# Patient Record
Sex: Female | Born: 1980
Health system: Southern US, Community
[De-identification: ages and names within clinical notes are randomized; demographics above are authoritative.]

## PROBLEM LIST (undated history)

## (undated) DIAGNOSIS — D649 Anemia, unspecified: Secondary | ICD-10-CM

## (undated) DIAGNOSIS — J45909 Unspecified asthma, uncomplicated: Secondary | ICD-10-CM

## (undated) DIAGNOSIS — T8859XA Other complications of anesthesia, initial encounter: Secondary | ICD-10-CM

## (undated) HISTORY — DX: Unspecified asthma, uncomplicated: J45.909

## (undated) HISTORY — PX: TONSILLECTOMY: SUR1361

## (undated) HISTORY — DX: Other complications of anesthesia, initial encounter: T88.59XA

---

## 2003-09-03 ENCOUNTER — Other Ambulatory Visit: Admission: RE | Admit: 2003-09-03 | Discharge: 2003-09-03 | Payer: Self-pay | Admitting: Obstetrics and Gynecology

## 2004-02-29 ENCOUNTER — Inpatient Hospital Stay (HOSPITAL_COMMUNITY): Admission: AD | Admit: 2004-02-29 | Discharge: 2004-02-29 | Payer: Self-pay | Admitting: *Deleted

## 2004-03-01 ENCOUNTER — Inpatient Hospital Stay (HOSPITAL_COMMUNITY): Admission: AD | Admit: 2004-03-01 | Discharge: 2004-03-03 | Payer: Self-pay | Admitting: Obstetrics and Gynecology

## 2008-12-29 ENCOUNTER — Emergency Department (HOSPITAL_BASED_OUTPATIENT_CLINIC_OR_DEPARTMENT_OTHER): Admission: EM | Admit: 2008-12-29 | Discharge: 2008-12-29 | Payer: Self-pay | Admitting: Emergency Medicine

## 2009-01-18 ENCOUNTER — Emergency Department (HOSPITAL_BASED_OUTPATIENT_CLINIC_OR_DEPARTMENT_OTHER): Admission: EM | Admit: 2009-01-18 | Discharge: 2009-01-18 | Payer: Self-pay | Admitting: Emergency Medicine

## 2009-08-06 ENCOUNTER — Emergency Department (HOSPITAL_BASED_OUTPATIENT_CLINIC_OR_DEPARTMENT_OTHER): Admission: EM | Admit: 2009-08-06 | Discharge: 2009-08-06 | Payer: Self-pay | Admitting: Emergency Medicine

## 2010-03-05 ENCOUNTER — Emergency Department (HOSPITAL_BASED_OUTPATIENT_CLINIC_OR_DEPARTMENT_OTHER): Admission: EM | Admit: 2010-03-05 | Discharge: 2010-03-05 | Payer: Self-pay | Admitting: Emergency Medicine

## 2010-03-05 ENCOUNTER — Ambulatory Visit: Payer: Self-pay | Admitting: Diagnostic Radiology

## 2010-03-09 ENCOUNTER — Ambulatory Visit: Payer: Self-pay | Admitting: Internal Medicine

## 2010-03-09 DIAGNOSIS — M25579 Pain in unspecified ankle and joints of unspecified foot: Secondary | ICD-10-CM

## 2010-05-26 ENCOUNTER — Ambulatory Visit: Payer: Self-pay | Admitting: Internal Medicine

## 2010-06-09 ENCOUNTER — Ambulatory Visit: Payer: Self-pay | Admitting: Internal Medicine

## 2010-06-09 ENCOUNTER — Ambulatory Visit (HOSPITAL_BASED_OUTPATIENT_CLINIC_OR_DEPARTMENT_OTHER): Admission: RE | Admit: 2010-06-09 | Discharge: 2010-06-09 | Payer: Self-pay | Admitting: Internal Medicine

## 2010-06-09 ENCOUNTER — Ambulatory Visit: Payer: Self-pay | Admitting: Interventional Radiology

## 2010-06-09 DIAGNOSIS — R042 Hemoptysis: Secondary | ICD-10-CM | POA: Insufficient documentation

## 2010-09-29 ENCOUNTER — Encounter: Payer: Self-pay | Admitting: Internal Medicine

## 2010-10-12 NOTE — Assessment & Plan Note (Signed)
Summary: NEW PT HURT FOOT FELL DOWN STAIRS/DT   Vital Signs:  Patient profile:   30 year old female Menstrual status:  regular LMP:     03/05/2010 Height:      67 inches Weight:      223.75 pounds BMI:     35.17 O2 Sat:      98 % on Room air Temp:     98.4 degrees F oral Pulse rate:   71 / minute Pulse rhythm:   regular Resp:     16 per minute BP sitting:   104 / 72  (right arm) Cuff size:   large  Vitals Entered By: Glendell Docker CMA (March 09, 2010 11:25 AM)  O2 Flow:  Room air CC: Rm 3-New Patient Is Patient Diabetic? No Pain Assessment Patient in pain? yes     Location: right ankle Intensity: 6 Type: aching Onset of pain  Constant Comments follow up, need FMLA form to be out of work to allow healing of her right ankle. she was going down the step and slid down step.  She currently has soreness in right ankle LMP (date): 03/05/2010     Menstrual Status regular Enter LMP: 03/05/2010 Last PAP Result normal   Primary Care Provider:  Dondra Spry DO  CC:  Rm 3-New Patient.  History of Present Illness: 30 y/o female to establish fell down steps 4 days ago at uncle's house twisted ankle slid down 4 steps not sure how she injured ankle pain is localized to about medial malleolus minimal swelling. pain is worse with wt bearing  taking tylenol NSAIDS cause abd pain  Preventive Screening-Counseling & Management  Alcohol-Tobacco     Alcohol drinks/day: 0     Smoking Status: never  Caffeine-Diet-Exercise     Caffeine use/day: 1 beverage daily     Does Patient Exercise: no  Allergies (verified): 1)  ! 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    Physical Exam  General:  alert, well-developed, and well-nourished.   Head:  normocephalic and atraumatic.   Eyes:  pupils equal, pupils round, and pupils reactive to light.   Mouth:  pharynx pink and moist.   Neck:  No deformities, masses, or tenderness noted. Lungs:  normal respiratory effort and normal breath sounds.   Heart:  normal rate, regular rhythm, and no gallop.   Abdomen:  soft, non-tender, normal bowel sounds, no masses, no hepatomegaly, and no splenomegaly.   Neurologic:  cranial nerves II-XII intact and gait normal.   Skin:  ankle joint stable.  medial ankle tenderness Psych:  normally interactive and good eye contact.     Impression & Recommendations:  Problem # 1:  ANKLE PAIN, RIGHT (ICD-719.47)  pt with recent fall down 4 steps. her pain is localized to medial lower ext about medial malleolus.  question medial ankle sprain.  refer to ortho for further eval  and tx  Orders: Orthopedic Referral (Ortho)  Patient Instructions: 1)  Please schedule a follow-up appointment in 1 month.  Current Allergies (reviewed today): ! PCN   Preventive Care Screening  Pap Smear:    Date:  05/04/2009    Results:  normal   Last Tetanus Booster:    Date:  01/27/2009    Results:  Historical

## 2010-10-12 NOTE — Assessment & Plan Note (Signed)
Summary: FOLLOW UP Stefanie Hale   Vital Signs:  Patient profile:   30 year old female Menstrual status:  regular Height:      67 inches Weight:      224.25 pounds BMI:     35.25 O2 Sat:      100 % on Room air Temp:     98.0 degrees F oral Pulse rate:   69 / minute Pulse rhythm:   regular Resp:     18 per minute BP sitting:   110 / 80  (right arm) Cuff size:   large  Vitals Entered By: Stefanie Hale CMA (June 09, 2010 11:08 AM)  O2 Flow:  Room air CC: follow-up visit Is Patient Diabetic? No Pain Assessment Patient in pain? no        Primary Care Stefanie Hale:  Stefanie Spry DO  CC:  follow-up visit.  History of Present Illness: 30 y/o female c/o waking up with blood tinged sputum or 1 and Hale months . symptoms intermittent. no oral lesions or sores  no chronic cough mild nose bleeding   no fevers or night sweats  overall amt of blood getting better  Preventive Screening-Counseling & Management  Alcohol-Tobacco     Smoking Status: never  Allergies: 1)  ! 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   Lungs:  normal respiratory effort and normal breath sounds.   Heart:  normal rate, regular rhythm, and no gallop.     Impression & Recommendations:  Problem # 1:  HEMOPTYSIS UNSPECIFIED (ICD-786.30) blood tinged sputum in 30 y/o female.  cxr negative.  no obvious source of bleeding on exam of nares.  refer to ENT for further eval Orders: T-2 View CXR, Same Day (71020.5TC) ENT Referral (ENT)  Current Allergies (reviewed today): ! PCN   Immunization History:  Influenza Immunization History:    Influenza:  declined (06/09/2010)   Contraindications/Deferment of  Procedures/Staging:    Test/Procedure: FLU VAX    Reason for deferment: patient declined

## 2010-10-28 NOTE — Consult Note (Signed)
Summary: [facility]  [facility]   Imported By: Lanelle Bal 10/20/2010 07:58:05  _____________________________________________________________________  External Attachment:    Type:   Image     Comment:   External Document

## 2010-10-30 ENCOUNTER — Emergency Department (HOSPITAL_BASED_OUTPATIENT_CLINIC_OR_DEPARTMENT_OTHER)
Admission: EM | Admit: 2010-10-30 | Discharge: 2010-10-30 | Disposition: A | Discharge status: Home / Self Care | Payer: BC Managed Care – PPO | Attending: Emergency Medicine | Admitting: Emergency Medicine

## 2010-10-30 DIAGNOSIS — R112 Nausea with vomiting, unspecified: Secondary | ICD-10-CM | POA: Insufficient documentation

## 2010-10-30 DIAGNOSIS — R197 Diarrhea, unspecified: Secondary | ICD-10-CM | POA: Insufficient documentation

## 2010-10-30 LAB — URINALYSIS, ROUTINE W REFLEX MICROSCOPIC
Ketones, ur: 15 mg/dL — AB
Leukocytes, UA: NEGATIVE
Nitrite: NEGATIVE
Specific Gravity, Urine: 1.03 (ref 1.005–1.030)
Urine Glucose, Fasting: NEGATIVE mg/dL
Urobilinogen, UA: 1 mg/dL (ref 0.0–1.0)
pH: 5.5 (ref 5.0–8.0)

## 2010-10-30 LAB — BASIC METABOLIC PANEL
BUN: 17 mg/dL (ref 6–23)
CO2: 24 mEq/L (ref 19–32)
Calcium: 9.7 mg/dL (ref 8.4–10.5)
Creatinine, Ser: 0.8 mg/dL (ref 0.4–1.2)
Glucose, Bld: 85 mg/dL (ref 70–99)

## 2010-10-30 LAB — PREGNANCY, URINE: Preg Test, Ur: NEGATIVE

## 2010-10-30 LAB — URINE MICROSCOPIC-ADD ON

## 2010-11-03 ENCOUNTER — Encounter: Payer: Self-pay | Admitting: Family

## 2010-11-03 ENCOUNTER — Ambulatory Visit (INDEPENDENT_AMBULATORY_CARE_PROVIDER_SITE_OTHER): Payer: BC Managed Care – PPO | Admitting: Family

## 2010-11-03 DIAGNOSIS — A088 Other specified intestinal infections: Secondary | ICD-10-CM

## 2010-11-09 NOTE — Assessment & Plan Note (Signed)
Summary: seen in ed on sat/norovirus/ss--rm 3   Vital Signs:  Patient profile:   29 year old female Menstrual status:  regular Height:      67 inches Weight:      199 pounds BMI:     31.28 Temp:     98.2 degrees F oral Pulse rate:   60 / minute Pulse rhythm:   regular Resp:     16 per minute BP sitting:   120 / 80  (right arm) Cuff size:   large  Vitals Entered By: Stefanie Hale CMA Stefanie Hale) (November 03, 2010 2:53 PM) CC: Pt here for ER follow up of norovirus. Needs clearance to return to work.  Is Patient Diabetic? No Pain Assessment Patient in pain? no      Comments Pt currently taking Promethazine 25mg  every 6 hours as needed for nausea. Stefanie Hale CMA Stefanie Hale)  November 03, 2010 3:00 PM    Primary Care Provider:  Dondra Spry DO  CC:  Pt here for ER follow up of norovirus. Needs clearance to return to work. .  History of Present Illness: Stefanie Hale is a 30 year old female who presents today for follow up from her ED visit on Satuday downstairs for Norovirus.   She was given rx for phenergan.  Was having nausea/vomitting/diarrhea that day.    No longer vomitting.  Tolerating by mouth's.  Pt has had one stool today which was loose.  Denies associated abdominal pain, but notes that she now has menses with regular cramping.  Allergies: 1)  ! Pcn  Past History:  Past Medical History: Last updated: 06/09/2010 Childhood Asthma     Past Surgical History: Last updated: 06/09/2010 Denies surgical history   Review of Systems       see HPI  Physical Exam  General:  Well-developed,well-nourished,in no acute distress; alert,appropriate and cooperative throughout examination Lungs:  Normal respiratory effort, chest expands symmetrically. Lungs are clear to auscultation, no crackles or wheezes. Heart:  Normal rate and regular rhythm. S1 and S2 normal without gallop, murmur, click, rub or other extra sounds. Abdomen:  soft, Non-distended.  + bowel sounds.   Mild generalized tenderness without guarding.   Impression & Recommendations:  Problem # 1:  VIRAL GASTROENTERITIS (ICD-008.8) Assessment Improved  Symptoms nearly resolved.  FMLA form filled for pt employer and given to patient.    Orders: Form Completion (04540)  Complete Medication List: 1)  Promethazine Hcl 25 Mg Tabs (Promethazine hcl) .... Take 1 tablet every 6 hours as needed for nausea. .  Patient Instructions: 1)  Call if you develop recurrent nausea, vomitting, diarrhea. 2)  Please schedule a complete physical with Dr. Artist Hale.   Orders Added: 1)  Est. Patient Level III [98119] 2)  Form Completion [14782]    Current Allergies (reviewed today): ! PCN

## 2010-11-09 NOTE — Letter (Signed)
Summary: Out of Work  Adult nurse at [employer]. [address]   [place], [place] [address]   Phone: [phone]  Fax: [phone]    November 03, 2010   Employee:  Stefanie Hale    To Whom It May Concern:   For Medical reasons, please excuse the above named employee from work for the following dates:  Start:   10/30/10  End:   May return to work on 11/04/10  If you need additional information, please feel free to contact our office.         Sincerely,    Lemont Fillers FNP

## 2010-11-24 ENCOUNTER — Encounter: Payer: Self-pay | Admitting: Internal Medicine

## 2010-11-24 ENCOUNTER — Ambulatory Visit (INDEPENDENT_AMBULATORY_CARE_PROVIDER_SITE_OTHER): Payer: BC Managed Care – PPO | Admitting: Family

## 2010-11-24 ENCOUNTER — Encounter: Payer: Self-pay | Admitting: Family

## 2010-11-24 DIAGNOSIS — R404 Transient alteration of awareness: Secondary | ICD-10-CM | POA: Insufficient documentation

## 2010-11-24 DIAGNOSIS — R040 Epistaxis: Secondary | ICD-10-CM | POA: Insufficient documentation

## 2010-11-24 LAB — CONVERTED CEMR LAB
Basophils Absolute: 0 10*3/uL (ref 0.0–0.1)
Basophils Relative: 0 % (ref 0–1)
Eosinophils Absolute: 0.1 10*3/uL (ref 0.0–0.7)
Eosinophils Relative: 3 % (ref 0–5)
HCT: 35.8 % — ABNORMAL LOW (ref 36.0–46.0)
Lymphs Abs: 1.7 10*3/uL (ref 0.7–4.0)
Monocytes Absolute: 0.3 10*3/uL (ref 0.1–1.0)
Monocytes Relative: 7 % (ref 3–12)
Neutrophils Relative %: 51 % (ref 43–77)
Platelets: 302 10*3/uL (ref 150–400)
RDW: 13.3 % (ref 11.5–15.5)

## 2010-11-26 ENCOUNTER — Telehealth: Payer: Self-pay | Admitting: Family

## 2010-11-30 NOTE — Assessment & Plan Note (Signed)
Summary: t/ent told pt that it was a blood vessel/ss--rm 4   Vital Signs:  Patient profile:   30 year old female Menstrual status:  regular Height:      67 inches Weight:      197.75 pounds BMI:     31.08 Temp:     98.7 degrees F oral Pulse rate:   66 / minute Pulse rhythm:   regular Resp:     16 per minute BP sitting:   104 / 70  (right arm) Cuff size:   large  Vitals Entered By: Mervin Kung CMA Duncan Dull) (November 24, 2010 11:36 AM) CC: Pt here for follow up. Has intermittent pressure in her throat. Is Patient Diabetic? No Pain Assessment Patient in pain? no      Comments Recently seen by ENT for bleeding in her mouth. Was told he did not see a cause but to try saline drops. Pt doesn't feel the drops are helping and bleeding seems to be increased. Mervin Kung CMA Duncan Dull)  November 24, 2010 11:43 AM    Primary Care Provider:  Dondra Spry DO  CC:  Pt here for follow up. Has intermittent pressure in her throat.Marland Kitchen  History of Present Illness: Ms.  Burbridge is a 30 year old female who presents today for follow up on her epistaxis.    1) Epistaxis-She notes that she sees blood in her throat in AM.  These symptoms have been going on for several month. Also has intermittent throat fullness. Using nasal saline twice daily as recommended.  Feels like she is seeing more blood than she was.   Denies blood from her nose. Does not feel like she is coughing up this blood.  Denies black/bloody stools or diarrhea.  She was seen by ENT (Dr. Verne Spurr) in January and it was felt that her bleeding was coming from her adenoids.  It was noted that she had andenotonsillar hypertrophy.  Pt was instructed to use nasal saline and reassurance was provided.  2) Snoring/Somnolence- Pt does not + snoring history.  Also reports daytime sleepiness.  She will accidentally dose off 2-3x a week by accident if she is watching TV or sitting still.  She denies every having fallen asleep while driving.  She  sleeps  6-7 hours a night.  Mother has sleep apnea.    Preventive Screening-Counseling & Management  Alcohol-Tobacco     Alcohol drinks/day: 0     Smoking Status: never  Allergies: 1)  ! Pcn  Past History:  Past Medical History: Last updated: 06/09/2010 Childhood Asthma     Past Surgical History: Last updated: 06/09/2010 Denies surgical history   Review of Systems       see HPI  Physical Exam  General:  Well-developed,well-nourished,in no acute distress; alert,appropriate and cooperative throughout examination Head:  Normocephalic and atraumatic without obvious abnormalities. No apparent alopecia or balding. Ears:  External ear exam shows no significant lesions or deformities.  Otoscopic examination reveals clear canals, tympanic membranes are intact bilaterally without bulging, retraction, inflammation or discharge. Hearing is grossly normal bilaterally. Mouth:  Narrow oropharynx, no erythema noted. No lesions. Lungs:  Normal respiratory effort, chest expands symmetrically. Lungs are clear to auscultation, no crackles or wheezes. Heart:  Normal rate and regular rhythm. S1 and S2 normal without gallop, murmur, click, rub or other extra sounds. Abdomen:  Bowel sounds positive,abdomen soft and non-tender without masses, organomegaly or hernias noted. Psych:  Cognition and judgment appear intact. Alert and cooperative with normal attention span  and concentration. No apparent delusions, illusions, hallucinations   Impression & Recommendations:  Problem # 1:  EPISTAXIS, RECURRENT (ICD-784.7) Assessment Comment Only Will check baseline CBC.  Recommended that she increase her nasal saline spray to three times a day.  Also recommended that she add a humidifier to her bedroom.   Problem # 2:  SOMNOLENCE (ICD-780.09) Assessment: Comment Only  Patient with daytime somnelence.  + family hx of sleep apnea.  I am concerned that she may suffer from sleep apnea and that the snoring  may be exacebating her epistaxis.  Will refer for sleep study.    Orders: Sleep Disorder Referral (Sleep Disorder)  Other Orders: TLB-CBC Platelet - w/Differential (85025-CBCD)  Patient Instructions: 1)  You wil be contacted about your referral for your sleep study.  2)  Add a humidifier to your bedroom. 3)  Call if you develop worsening bleeding. 4)  Follow up with Dr. Artist Pais in 1 month.   Orders Added: 1)  TLB-CBC Platelet - w/Differential [85025-CBCD] 2)  Sleep Disorder Referral [Sleep Disorder] 3)  New Patient Level IV [16109]    Current Allergies (reviewed today): ! PCN

## 2010-12-09 NOTE — Progress Notes (Signed)
Summary: lab resul  Phone Note Outgoing Call   Summary of Call: Please call patient and let her know that she is mildy anemic.  I would like her to add a multivitamin with minerals once daily and keep her upcoming appointment with Dr. Artist Pais in May. Initial call taken by: Lemont Fillers FNP,  November 26, 2010 8:56 AM  Follow-up for Phone Call        Left message on machine to return my call. Nicki Guadalajara Fergerson CMA Duncan Dull)  November 26, 2010 10:17 AM   Pt advised. Nicki Guadalajara Fergerson CMA Duncan Dull)  November 29, 2010 2:21 PM     New/Updated Medications: WOMENS MULTIVITAMIN PLUS  TABS (MULTIPLE VITAMINS-MINERALS) one tablet by mouth daily

## 2010-12-15 LAB — CBC
Hemoglobin: 12.9 g/dL (ref 12.0–15.0)
Platelets: 282 10*3/uL (ref 150–400)
RBC: 4.19 MIL/uL (ref 3.87–5.11)
RDW: 12.2 % (ref 11.5–15.5)

## 2010-12-15 LAB — URINALYSIS, ROUTINE W REFLEX MICROSCOPIC
Bilirubin Urine: NEGATIVE
Hgb urine dipstick: NEGATIVE
Ketones, ur: NEGATIVE mg/dL
Nitrite: NEGATIVE
Protein, ur: NEGATIVE mg/dL
Specific Gravity, Urine: 1.018 (ref 1.005–1.030)

## 2010-12-15 LAB — COMPREHENSIVE METABOLIC PANEL
Alkaline Phosphatase: 63 U/L (ref 39–117)
CO2: 27 mEq/L (ref 19–32)
GFR calc Af Amer: >60 mL/min (ref >60)
Potassium: 4.2 mEq/L (ref 3.5–5.1)
Sodium: 146 mEq/L — ABNORMAL HIGH (ref 135–145)
Total Protein: 8.9 g/dL — ABNORMAL HIGH (ref 6.0–8.3)

## 2010-12-15 LAB — DIFFERENTIAL
Eosinophils Relative: 1 % (ref 0–5)
Lymphocytes Relative: 22 % (ref 12–46)
Lymphs Abs: 0.8 10*3/uL (ref 0.7–4.0)
Monocytes Absolute: 0.1 10*3/uL (ref 0.1–1.0)

## 2010-12-15 LAB — PREGNANCY, URINE: Preg Test, Ur: NEGATIVE

## 2010-12-15 LAB — LIPASE, BLOOD: Lipase: 84 U/L (ref 23–300)

## 2010-12-28 ENCOUNTER — Encounter (HOSPITAL_BASED_OUTPATIENT_CLINIC_OR_DEPARTMENT_OTHER): Payer: BC Managed Care – PPO

## 2011-01-11 ENCOUNTER — Encounter: Payer: Self-pay | Admitting: Internal Medicine

## 2011-01-12 ENCOUNTER — Ambulatory Visit: Payer: BC Managed Care – PPO | Admitting: Internal Medicine

## 2011-01-12 DIAGNOSIS — Z0289 Encounter for other administrative examinations: Secondary | ICD-10-CM

## 2011-01-28 NOTE — H&P (Signed)
NAME:  Stefanie Hale, Stefanie Hale                    ACCOUNT NO.:  [id number]   MEDICAL RECORD NO.:  [id number]                   PATIENT TYPE:  INP   LOCATION:  9169                                 FACILITY:  [facility]   PHYSICIAN:  Crist Fat. Rivard, M.D.              DATE OF BIRTH:  Jun 10, 1981   DATE OF ADMISSION:  03/01/2004  DATE OF DISCHARGE:                                HISTORY & PHYSICAL   Stefanie Hale is a 30 year old gravida 2, para 0-0-1-0, at 40-3/7 weeks,  who presented with uterine contractions increasing in intensity all night.  She was seen on June 19 for a labor check.  She was 2 cm then.  She was sent  home since she was having minimal contractions.  She reports positive bloody  show and reports positive fetal movement.  Pregnancy has been remarkable  for:   1. Slightly late to care at 14 weeks.  2. 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  3. Trichomonas in January 2005.  4. Slightly decreased fluid on 35-week ultrasound, which resolved by 37     weeks.    PRENATAL LABORATORY DATA:  Blood type is B positive, Rh antibody showed an  anti-LEA inconclusive antibody, and no follow-up was needed.  Sickle cell  test was negative.  Syphilis test was negative.  Rubella titer was positive.  Hepatitis B surface antigen was negative.  HIV was nonreactive.  GC and  Chlamydia cultures were negative.  Pap showed Trichomonas, and she was  treated for this.  Hemoglobin upon entering the practice was 10.9.  It was  10.8 at 26 weeks.  AFP was normal.  Group B strep cultures and GC and  Chlamydia cultures were negative at 36 weeks.  EDC of February 27, 2004, was  established by last menstrual period and was in agreement with ultrasound at  approximately 18 weeks.   HISTORY OF PRESENT PREGNANCY:  The patient entered care at approximately 14  weeks.  She declined cystic fibrosis testing.  Her antibody screen showed  anti-LEA, which was inconclusive.  Consult with Dr. Normand Sloop was  accomplished, and she reported no further follow-up.  Dr. Sherrie George at [facility] also concurred with the plan.  Trichomonas was found on her Pap  and was treated with metronidazole.  She had an ultrasound at 18 weeks that  showed normal growth and development.  She had some slight spotting at  approximately 31 weeks, but she did not call with that at the time.  This  was reported subsequently.  She had an ultrasound at 35 weeks secondary to  slight size greater than dates and position.  Growth was in the 68th-72nd  percentile.  Fluid was 6.3 cm.  There was no evidence of leaking.  An NST  was done.  It was reactive, and the patient was placed on increasing rest  and fluids.  This ultrasound was repeated at 36 weeks, which had a BPP of  8/8 and fluid increased to 8.1 cm.  The rest of her pregnancy was  essentially uncomplicated.   PAST OBSTETRICAL HISTORY:  In 2001 she had a spontaneous miscarriage without  problems in the first trimester.   PAST MEDICAL HISTORY:  She is a previous condom user.  She has a history of  asthma but has had no need for medications any time in the last several  years.  She had a history of mono and strep in August 2004.   She is sensitive to CITRIC ACID.   FAMILY HISTORY:  Mother, maternal grandmother, maternal grandfather, and  maternal great-grandmother have hypertension.  Her mother and maternal  grandmother have had strokes.  Her aunt has varicose veins.  Her mother has  low iron.  Her uncle and maternal grandfather have diabetes.  Maternal  grandmother had skin cancer.  Mother had migraine headaches.  Maternal great-  grandmother had Alzheimer's.   GENETIC HISTORY:  Unremarkable.   SOCIAL HISTORY:  The patient is single.  The father of the baby is involved  and supportive.  His name is Stefanie Hale.  The patient is [place], of the [employer].  She has one year of college and is employed  at [employer]  Her partner is a Engineer, structural.  He is employed  at a [employer].  She has been followed by the certified nurse midwife  service at [facility].  She denies any alcohol, drug, or tobacco  use during this pregnancy.   PHYSICAL EXAMINATION:  VITAL SIGNS:  The patient is afebrile.  Other vital  signs are stable.  HEENT:  Within normal limits.  CHEST:  Bilateral breath sounds are clear.  CARDIAC:  Regular rate and rhythm without murmur.  BREASTS:  Soft and nontender.  ABDOMEN:  Fundal height is approximately 39 cm.  Estimated fetal weight is 7-  8 pounds.  Uterine contractions are every three to four minutes, moderate  quality.  Fetal heart rate is reactive with occasional early decelerations.  Baseline is 120's-130.  PELVIC:  Cervical exam 4-5 cm, 80%, vertex at a -1 station with an intact  bag of water.  EXTREMITIES:  Deep tendon reflexes are 2+ without clonus.  There is a trace  edema noted.   IMPRESSION:  1. Intrauterine pregnancy at 40-3/7 weeks.  2. Early active labor.   PLAN:  1. Admit to birthing suite per consult with Dr. Estanislado Pandy as attending     physician.  2. Routine certified nurse midwife orders.  3. Plan epidural per patient request.     Chip Boer L. Emilee Hero, C.N.M.                   Crist Fat Rivard, M.D.    Leeanne Mannan  D:  03/01/2004  T:  03/01/2004  Job:  [id number]

## 2011-07-19 ENCOUNTER — Ambulatory Visit (INDEPENDENT_AMBULATORY_CARE_PROVIDER_SITE_OTHER): Payer: BC Managed Care – PPO | Admitting: Family

## 2011-07-19 ENCOUNTER — Encounter: Payer: Self-pay | Admitting: Family

## 2011-07-19 DIAGNOSIS — B9789 Other viral agents as the cause of diseases classified elsewhere: Secondary | ICD-10-CM

## 2011-07-19 DIAGNOSIS — B349 Viral infection, unspecified: Secondary | ICD-10-CM

## 2011-07-19 NOTE — Progress Notes (Signed)
  Subjective:    Patient ID: Stefanie Hale, female    DOB: 17-Nov-1980, 30 y.o.   MRN: [id number]  HPI  Pt presents following an episode of weakness at work yesterday (works at [employer]).  She was noted to have BP 98/50.  Felt shakey.  She reports that her symptoms did not improve with eating lunch.  She denies fever, nausea, vomitting, or cold symptoms.  Mild dizziness.  Appetite  is poor today.  LMP was 3 weeks ago.  +malaise/myalgias. Review of Systems See HPI  Past Medical History  Diagnosis Date  . Childhood asthma     History   Social History  . Marital Status: Single    Spouse Name: N/A    Number of Children: N/A  . Years of Education: N/A   Occupational History  . Not on file.   Social History Main Topics  . Smoking status: Never Smoker   . Smokeless tobacco: Not on file  . Alcohol Use: Not on file  . Drug Use: Not on file  . Sexually Active: Not on file   Other Topics Concern  . 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 No past surgeries     denies surgical history    Family History  Problem Relation Age of Onset  . Arthritis    . Hypertension    . Stroke      Allergies  Allergen Reactions  . Penicillins     REACTION: Rash    Current Outpatient Prescriptions on File Prior to Visit  Medication Sig Dispense Refill  . Multiple Vitamin (MULTIVITAMIN) tablet Take 1 tablet by mouth daily.          BP 114/82  Pulse 68  Temp(Src) 98.2 F (36.8 C) (Oral)  Resp 16  Wt 187 lb 1.3 oz (84.859 kg)  SpO2 100%  LMP 06/28/2011       Objective:   Physical Exam  Constitutional: She appears well-developed and well-nourished. No distress.  HENT:  Right Ear: Tympanic membrane and ear canal normal.    Left Ear: Tympanic membrane and ear canal normal.  Mouth/Throat: Uvula is midline, oropharynx is clear and moist and mucous membranes are normal.  Eyes: Conjunctivae are normal. No scleral icterus.  Cardiovascular: Normal rate and regular rhythm.   No murmur heard. Pulmonary/Chest: Effort normal and breath sounds normal. No respiratory distress. She has no wheezes. She has no rales. She exhibits no tenderness.  Musculoskeletal: She exhibits no edema.  Skin: Skin is warm and dry.  Psychiatric: She has a normal mood and affect. Her behavior is normal. Judgment and thought content normal.          Assessment & Plan:

## 2011-07-19 NOTE — Assessment & Plan Note (Signed)
Suspect viral etiology.  Recommended hydration, rest, Motrin PRN.  Work note provided.  Pt to call us if symptoms worsen or if not feeling better in 2-3 days.

## 2011-07-19 NOTE — Patient Instructions (Signed)
You may use motrin as needed for pain, comfort or fever. Call if your symptoms worsen or if you are not feeling better in 2-3 days.

## 2011-07-22 ENCOUNTER — Encounter: Payer: Self-pay | Admitting: Family

## 2011-08-25 ENCOUNTER — Ambulatory Visit: Payer: BC Managed Care – PPO | Admitting: Internal Medicine

## 2011-09-26 ENCOUNTER — Ambulatory Visit: Payer: BC Managed Care – PPO | Admitting: Family

## 2011-09-27 ENCOUNTER — Encounter: Payer: Self-pay | Admitting: Family

## 2011-09-27 ENCOUNTER — Ambulatory Visit (INDEPENDENT_AMBULATORY_CARE_PROVIDER_SITE_OTHER): Payer: BC Managed Care – PPO | Admitting: Family

## 2011-09-27 ENCOUNTER — Telehealth: Payer: Self-pay | Admitting: Family

## 2011-09-27 VITALS — BP 110/80 | HR 78 | Temp 98.2°F | Resp 18 | Wt 193.0 lb

## 2011-09-27 DIAGNOSIS — J020 Streptococcal pharyngitis: Secondary | ICD-10-CM

## 2011-09-27 DIAGNOSIS — J029 Acute pharyngitis, unspecified: Secondary | ICD-10-CM

## 2011-09-27 LAB — POCT RAPID STREP A (OFFICE): Rapid Strep A Screen: POSITIVE — AB

## 2011-09-27 MED ORDER — CLARITHROMYCIN 500 MG PO TABS: 500.0000 mg | ORAL_TABLET | Freq: Two times a day (BID) | ORAL | Status: AC

## 2011-09-27 MED ORDER — CEFUROXIME AXETIL 500 MG PO TABS: 500.0000 mg | ORAL_TABLET | Freq: Two times a day (BID) | ORAL | Status: DC

## 2011-09-27 NOTE — Telephone Encounter (Signed)
Called pharmacy, spoke to pharmacist- cancelled rx for ceftin as pt tells me that her reaction was hives not rash as indicated in chart.

## 2011-09-27 NOTE — Patient Instructions (Addendum)
Call if your symptoms worsen, or if you are not feeling better in 2-3 days. You may use motrin for pain and/or chloraseptic spray.

## 2011-09-27 NOTE — Progress Notes (Signed)
  Subjective:    Patient ID: Stefanie Hale, female    DOB: [name], 1982, 31 y.o.   MRN: [id number]  HPI  Pt is a 31 yr old female who presents today with chief complaint of sore throat.  She reports that symptoms started last Friday with diarrhea. She then developed nasal congestion.  Now has developed sore throat.  She describes pain as moderate: a 6/10.  Has felt hot, but has not taken her temperature. Has tried mucinex without improvement.  Her son was sick.  She had a flu shot.     Review of Systems See HPI  Past Medical History  Diagnosis Date  . Childhood asthma     History   Social History  . Marital Status: Single    Spouse Name: N/A    Number of Children: N/A  . Years of Education: N/A   Occupational History  . Not on file.   Social History Main Topics  . Smoking status: Never Smoker   . Smokeless tobacco: Never Used  . Alcohol Use: Yes  . Drug Use: No  . Sexually Active: Not on file   Other Topics Concern  . 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 No past surgeries     denies surgical history    Family History  Problem Relation Age of Onset  . Arthritis    . Hypertension    . Stroke      Allergies  Allergen Reactions  . Penicillins     REACTION: Rash    Current Outpatient Prescriptions on File Prior to Visit  Medication Sig Dispense Refill  . Multiple Vitamin (MULTIVITAMIN) tablet Take 1 tablet by mouth daily.          BP 110/80  Pulse 78  Temp(Src) 98.2 F (36.8 C) (Oral)  Resp 18  Wt 193 lb (87.544 kg)  SpO2 100%  LMP 09/22/2011       Objective:   Physical Exam  Constitutional: She appears well-developed and well-nourished. No distress.  HENT:  Right Ear: Tympanic membrane  and ear canal normal.  Left Ear: Tympanic membrane and ear canal normal.  Mouth/Throat: Posterior oropharyngeal erythema present. No oropharyngeal exudate or posterior oropharyngeal edema.       Halitosis  Cardiovascular: Normal rate and regular rhythm.   No murmur heard. Pulmonary/Chest: Effort normal and breath sounds normal. No respiratory distress. She has no wheezes. She has no rales. She exhibits no tenderness.  Psychiatric: She has a normal mood and affect. Her behavior is normal. Judgment and thought content normal.          Assessment & Plan:

## 2011-09-27 NOTE — Assessment & Plan Note (Addendum)
Rapid strep is +. Plan to treat with biaxin due to hx of "bad" hives with penicillins.

## 2011-09-29 ENCOUNTER — Telehealth: Payer: Self-pay | Admitting: *Deleted

## 2011-09-29 MED ORDER — CEFDINIR 300 MG PO CAPS: 300.0000 mg | ORAL_CAPSULE | Freq: Two times a day (BID) | ORAL | Status: AC

## 2011-09-29 NOTE — Telephone Encounter (Signed)
Pt's mother called stating that pt is unable to take Biaxin due to stomach cramping and diarrhea. She is requesting another alternative. States pt is still very sick and will be unable to return to work on Friday as stated in her doctor's note. Pt would like a new work note to state that she will return to work on Sunday. Please advise re: medication alternative and if ok to update work note.

## 2011-09-29 NOTE — Telephone Encounter (Signed)
Left detailed message on voicemail re: med change and work note will be at the front desk for pick up and to call if any questions.

## 2011-09-29 NOTE — Telephone Encounter (Signed)
Stop biaxin, start cefdinir.  If no improvement by tomorrow, then she should be re-evaluated in office. OK to extend work note.

## 2011-11-03 ENCOUNTER — Encounter: Payer: BC Managed Care – PPO | Admitting: Internal Medicine

## 2011-11-10 ENCOUNTER — Encounter: Payer: BC Managed Care – PPO | Admitting: Internal Medicine

## 2011-11-17 ENCOUNTER — Encounter: Payer: Self-pay | Admitting: Internal Medicine

## 2011-11-17 ENCOUNTER — Ambulatory Visit (INDEPENDENT_AMBULATORY_CARE_PROVIDER_SITE_OTHER): Payer: BC Managed Care – PPO | Admitting: Internal Medicine

## 2011-11-17 VITALS — BP 100/70 | HR 69 | Temp 98.4°F | Resp 18 | Ht 67.0 in | Wt 192.0 lb

## 2011-11-17 DIAGNOSIS — Z0000 Encounter for general adult medical examination without abnormal findings: Secondary | ICD-10-CM | POA: Insufficient documentation

## 2011-11-17 LAB — CBC WITH DIFFERENTIAL/PLATELET
Eosinophils Absolute: 0.1 10*3/uL (ref 0.0–0.7)
Lymphocytes Relative: 42 % (ref 12–46)
Lymphs Abs: 1.1 10*3/uL (ref 0.7–4.0)
MCH: 30.2 pg (ref 26.0–34.0)
MCHC: 32.7 g/dL (ref 30.0–36.0)
MCV: 92.6 fL (ref 78.0–100.0)
Neutrophils Relative %: 44 % (ref 43–77)
Platelets: 292 10*3/uL (ref 150–400)
RBC: 3.77 MIL/uL — ABNORMAL LOW (ref 3.87–5.11)
WBC: 2.7 10*3/uL — ABNORMAL LOW (ref 4.0–10.5)

## 2011-11-17 LAB — LIPID PANEL
Cholesterol: 165 mg/dL (ref 0–200)
VLDL: 6 mg/dL (ref 0–40)

## 2011-11-17 LAB — HEPATIC FUNCTION PANEL
ALT: 8 U/L (ref 0–35)
Albumin: 4 g/dL (ref 3.5–5.2)
Bilirubin, Direct: 0.1 mg/dL (ref 0.0–0.3)
Total Bilirubin: 0.6 mg/dL (ref 0.3–1.2)
Total Protein: 7.1 g/dL (ref 6.0–8.3)

## 2011-11-17 LAB — VITAMIN B12: Vitamin B-12: 752 pg/mL (ref 211–911)

## 2011-11-17 LAB — BASIC METABOLIC PANEL
Chloride: 106 mEq/L (ref 96–112)
Potassium: 4.6 mEq/L (ref 3.5–5.3)

## 2011-11-17 LAB — TSH: TSH: 0.949 u[IU]/mL (ref 0.350–4.500)

## 2011-11-17 MED ORDER — NYSTATIN 100000 UNIT/GM EX OINT: TOPICAL_OINTMENT | Freq: Two times a day (BID) | CUTANEOUS | Status: AC

## 2011-11-17 NOTE — Assessment & Plan Note (Signed)
Obtain cpe labs. Schedule pap when not menstruating. Attempt nystatin bid for rash. Monitor crepitus as it is without pain or effect on fxn.

## 2011-11-17 NOTE — Patient Instructions (Signed)
Please call to schedule your pap smear or for a gynecologist referral whichever you prefer

## 2011-11-17 NOTE — Progress Notes (Signed)
  Subjective:    Patient ID: Stefanie Hale, female    DOB: 1981-07-05, 31 y.o.   MRN: [id number]  HPI Pt presents to clinic for annual exam. Notes h/o mild anemia without specific etiology. Sometimes takes iron supplement. Last pap ~2 years ago but now menstruating. Recent pharyngitis treated with abx resulted in vaginal yeast infxn cleared by otc monistat however developed ?yeast rash perineum to near rectum. Attempted monistat topically without resolution. Notes intermittent inframammary rash. Has R>L knee crepitus without pain especially when climbing stairs.  Past Medical History  Diagnosis Date  . Childhood asthma    Past Surgical History  Procedure Date  . No past surgeries     denies surgical history    reports that she has never smoked. She has never used smokeless tobacco. She reports that she drinks alcohol. She reports that she does not use illicit drugs. family history includes Arthritis in an unspecified family member; Hypertension in an unspecified family member; and Stroke in an unspecified family member. Allergies  Allergen Reactions  . Penicillins     REACTION: hives     Review of Systems see hpi     Objective:   Physical Exam  Nursing note and vitals reviewed. Constitutional: She appears well-developed and well-nourished. No distress.  HENT:  Head: Normocephalic and atraumatic.  Right Ear: Tympanic membrane and ear canal normal.  Left Ear: Tympanic membrane, external ear and ear canal normal.  Nose: Nose normal.  Mouth/Throat: Oropharynx is clear and moist. No oropharyngeal exudate.  Eyes: Conjunctivae and EOM are normal. Pupils are equal, round, and reactive to light. No scleral icterus.  Neck: Neck supple. Carotid bruit is not present. No thyromegaly present.  Cardiovascular: Normal rate, regular rhythm and normal heart sounds.  Exam reveals no gallop and no friction rub.   No murmur heard. Pulmonary/Chest: Effort normal and breath sounds normal. No  respiratory distress. She has no wheezes. She has no rales.  Abdominal: Soft. Bowel sounds are normal. She exhibits no distension and no mass. There is no hepatosplenomegaly. There is no tenderness. There is no rebound and no guarding.  Musculoskeletal:       Knee: bilateral knee crepitus without tenderness, decreased ROM, erythema, warmth or effusion  Lymphadenopathy:    She has no cervical adenopathy.  Neurological: She is alert.  Skin: Skin is warm and dry. She is not diaphoretic.  Psychiatric: She has a normal mood and affect.          Assessment & Plan:

## 2011-11-18 LAB — URINALYSIS, ROUTINE W REFLEX MICROSCOPIC
Bilirubin Urine: NEGATIVE
Glucose, UA: NEGATIVE mg/dL
Hgb urine dipstick: NEGATIVE
Ketones, ur: NEGATIVE mg/dL
Nitrite: NEGATIVE
Specific Gravity, Urine: 1.017 (ref 1.005–1.030)
Urobilinogen, UA: 1 mg/dL (ref 0.0–1.0)
pH: 7 (ref 5.0–8.0)

## 2011-11-25 ENCOUNTER — Other Ambulatory Visit: Payer: Self-pay | Admitting: Internal Medicine

## 2011-11-25 DIAGNOSIS — D72819 Decreased white blood cell count, unspecified: Secondary | ICD-10-CM

## 2011-12-27 ENCOUNTER — Ambulatory Visit (INDEPENDENT_AMBULATORY_CARE_PROVIDER_SITE_OTHER): Payer: BC Managed Care – PPO | Admitting: Internal Medicine

## 2011-12-27 ENCOUNTER — Encounter: Payer: Self-pay | Admitting: Internal Medicine

## 2011-12-27 ENCOUNTER — Other Ambulatory Visit (HOSPITAL_COMMUNITY)
Admission: RE | Admit: 2011-12-27 | Discharge: 2011-12-27 | Disposition: A | Discharge status: Home / Self Care | Payer: BC Managed Care – PPO | Source: Ambulatory Visit | Attending: Internal Medicine | Admitting: Internal Medicine

## 2011-12-27 VITALS — BP 112/72 | HR 74 | Temp 98.4°F | Resp 18 | Wt 191.0 lb

## 2011-12-27 DIAGNOSIS — N898 Other specified noninflammatory disorders of vagina: Secondary | ICD-10-CM

## 2011-12-27 DIAGNOSIS — Z124 Encounter for screening for malignant neoplasm of cervix: Secondary | ICD-10-CM

## 2011-12-27 DIAGNOSIS — R21 Rash and other nonspecific skin eruption: Secondary | ICD-10-CM

## 2011-12-27 DIAGNOSIS — Z113 Encounter for screening for infections with a predominantly sexual mode of transmission: Secondary | ICD-10-CM | POA: Insufficient documentation

## 2011-12-27 DIAGNOSIS — D72819 Decreased white blood cell count, unspecified: Secondary | ICD-10-CM

## 2011-12-27 DIAGNOSIS — Z01419 Encounter for gynecological examination (general) (routine) without abnormal findings: Secondary | ICD-10-CM | POA: Insufficient documentation

## 2011-12-27 DIAGNOSIS — D649 Anemia, unspecified: Secondary | ICD-10-CM

## 2011-12-27 MED ORDER — FLUCONAZOLE 100 MG PO TABS: 100.0000 mg | ORAL_TABLET | Freq: Every day | ORAL | Status: AC

## 2011-12-28 ENCOUNTER — Other Ambulatory Visit: Payer: Self-pay | Admitting: Internal Medicine

## 2011-12-28 DIAGNOSIS — D649 Anemia, unspecified: Secondary | ICD-10-CM | POA: Insufficient documentation

## 2011-12-28 DIAGNOSIS — R21 Rash and other nonspecific skin eruption: Secondary | ICD-10-CM | POA: Insufficient documentation

## 2011-12-28 DIAGNOSIS — N898 Other specified noninflammatory disorders of vagina: Secondary | ICD-10-CM | POA: Insufficient documentation

## 2011-12-28 DIAGNOSIS — Z124 Encounter for screening for malignant neoplasm of cervix: Secondary | ICD-10-CM | POA: Insufficient documentation

## 2011-12-28 LAB — CBC WITH DIFFERENTIAL/PLATELET
Basophils Absolute: 0 10*3/uL (ref 0.0–0.1)
Basophils Relative: 0 % (ref 0–1)
Eosinophils Absolute: 0.2 10*3/uL (ref 0.0–0.7)
HCT: 37.2 % (ref 36.0–46.0)
Hemoglobin: 12.3 g/dL (ref 12.0–15.0)
MCH: 30 pg (ref 26.0–34.0)
MCV: 90.7 fL (ref 78.0–100.0)
Monocytes Relative: 7 % (ref 3–12)
Platelets: 278 10*3/uL (ref 150–400)
RDW: 12.2 % (ref 11.5–15.5)

## 2011-12-28 LAB — WET PREP BY MOLECULAR PROBE: Gardnerella vaginalis: POSITIVE — AB

## 2011-12-28 MED ORDER — METRONIDAZOLE 500 MG PO TABS: 500.0000 mg | ORAL_TABLET | Freq: Two times a day (BID) | ORAL | Status: AC

## 2011-12-28 NOTE — Progress Notes (Signed)
  Subjective:    Patient ID: Stefanie Hale, female    DOB: 08-02-1981, 31 y.o.   MRN: [id number]  HPI Pt presents to clinic for pap smear and follow up of rash and anemia. Noted with mild anemia on cbc without gross active bleeding. S/p nystatin for perineal rash with improvement without resolution. No other alleviating or exacerbating factors. No other complaints.  Past Medical History  Diagnosis Date  . Childhood asthma    Past Surgical History  Procedure Date  . No past surgeries     denies surgical history    reports that she has never smoked. She has never used smokeless tobacco. She reports that she drinks alcohol. She reports that she does not use illicit drugs. family history includes Arthritis in an unspecified family member; Hypertension in an unspecified family member; and Stroke in an unspecified family member. Allergies  Allergen Reactions  . Penicillins     REACTION: hives     Review of Systems see hpi     Objective:   Physical Exam  Nursing note and vitals reviewed. Constitutional: She appears well-developed and well-nourished. No distress.  HENT:  Head: Normocephalic and atraumatic.  Genitourinary:       With female nurse escort exam is performed. Ext genitalia within nl limits. Speculum exam demonstrates moderate vaginal discharge tan/white without vaginal mucosal lesion. Pap smear obtained.  Neurological: She is alert.  Skin: Skin is warm and dry. She is not diaphoretic.  Psychiatric: She has a normal mood and affect.          Assessment & Plan:

## 2011-12-28 NOTE — Assessment & Plan Note (Signed)
Obtain koh, wet prep, gc and chlamydia.

## 2011-12-28 NOTE — Assessment & Plan Note (Signed)
Attempt diflucan. Followup if no improvement or worsening.

## 2011-12-28 NOTE — Assessment & Plan Note (Signed)
With associated leukopenia. Repeat cbc with diff.

## 2011-12-28 NOTE — Assessment & Plan Note (Signed)
Pap smear pending.

## 2011-12-29 ENCOUNTER — Encounter: Payer: Self-pay | Admitting: Internal Medicine

## 2011-12-29 ENCOUNTER — Ambulatory Visit (INDEPENDENT_AMBULATORY_CARE_PROVIDER_SITE_OTHER): Payer: BC Managed Care – PPO | Admitting: Internal Medicine

## 2011-12-29 ENCOUNTER — Other Ambulatory Visit: Payer: Self-pay | Admitting: Internal Medicine

## 2011-12-29 VITALS — BP 118/62 | HR 76 | Temp 98.5°F

## 2011-12-29 DIAGNOSIS — M549 Dorsalgia, unspecified: Secondary | ICD-10-CM

## 2011-12-29 DIAGNOSIS — Z01419 Encounter for gynecological examination (general) (routine) without abnormal findings: Secondary | ICD-10-CM

## 2011-12-29 MED ORDER — METHYLPREDNISOLONE ACETATE 40 MG/ML IJ SUSP
40.0000 mg | Freq: Once | INTRAMUSCULAR | Status: AC
  Administered 2011-12-29: 40 mg via INTRAMUSCULAR

## 2011-12-29 MED ORDER — DICLOFENAC SODIUM 75 MG PO TBEC: DELAYED_RELEASE_TABLET | ORAL | Status: AC

## 2011-12-29 MED ORDER — KETOROLAC TROMETHAMINE 30 MG/ML IJ SOLN
30.0000 mg | Freq: Once | INTRAMUSCULAR | Status: AC
  Administered 2011-12-29: 30 mg via INTRAMUSCULAR

## 2011-12-29 MED ORDER — HYDROCODONE-ACETAMINOPHEN 5-500 MG PO TABS: 1.0000 | ORAL_TABLET | Freq: Four times a day (QID) | ORAL | Status: AC | PRN

## 2011-12-29 NOTE — Progress Notes (Signed)
Received call from cytology that 12/27/11 pap smear was ordered under [facility] and needs to be ordered under [facility]. Unable to cancel previous pap smear, order re-entered.

## 2012-01-01 DIAGNOSIS — M549 Dorsalgia, unspecified: Secondary | ICD-10-CM | POA: Insufficient documentation

## 2012-01-01 NOTE — Progress Notes (Signed)
  Subjective:    Patient ID: Stefanie Hale, female    DOB: March 06, 1981, 31 y.o.   MRN: [id number]  HPI Pt presents to clinic for evaluation of back pain. Notes one day h/o right low back pain that occurs while bending and putting pants on. Pain is sharp and radiates to the right thigh without further radiation, paresthesia, leg weakness or incontinence. Notes pain is severe. No fall or trauma involved. Taking otc pain medication without improvement. Pain worse with movement. No other alleviating or exacerbating factors.   Past Medical History  Diagnosis Date  . Childhood asthma    Past Surgical History  Procedure Date  . No past surgeries     denies surgical history    reports that she has never smoked. She has never used smokeless tobacco. She reports that she drinks alcohol. She reports that she does not use illicit drugs. family history includes Arthritis in an unspecified family member; Hypertension in an unspecified family member; and Stroke in an unspecified family member. Allergies  Allergen Reactions  . Penicillins     REACTION: hives     Review of Systems see hpi     Objective:   Physical Exam  Constitutional: She appears well-developed and well-nourished. No distress.  HENT:  Head: Normocephalic and atraumatic.  Eyes: Conjunctivae are normal. No scleral icterus.  Neck: Neck supple.  Musculoskeletal:       No midline ls tenderness or bony abn. Right paraspinal muscle tenderness without overt spasm. Modified slr neg.bilateral le strength 5/5.  Neurological: She is alert.  Skin: Skin is warm and dry. She is not diaphoretic.  Psychiatric: She has a normal mood and affect.          Assessment & Plan:

## 2012-01-01 NOTE — Assessment & Plan Note (Signed)
Administer depomedrol im and toradol im. Attempt voltraren prn with food and no other nsaids. lortab prn short term. Work note provided. Followup if no improvement or worsening.

## 2013-07-01 ENCOUNTER — Ambulatory Visit (INDEPENDENT_AMBULATORY_CARE_PROVIDER_SITE_OTHER): Payer: BC Managed Care – PPO | Admitting: Family

## 2013-07-01 ENCOUNTER — Encounter: Payer: Self-pay | Admitting: Family

## 2013-07-01 VITALS — BP 118/80 | HR 79 | Temp 98.7°F | Resp 16 | Ht 67.0 in | Wt 205.0 lb

## 2013-07-01 DIAGNOSIS — E039 Hypothyroidism, unspecified: Secondary | ICD-10-CM

## 2013-07-01 DIAGNOSIS — N926 Irregular menstruation, unspecified: Secondary | ICD-10-CM | POA: Insufficient documentation

## 2013-07-01 DIAGNOSIS — Z309 Encounter for contraceptive management, unspecified: Secondary | ICD-10-CM

## 2013-07-01 LAB — POCT URINE HCG BY VISUAL COLOR COMPARISON TESTS: Preg Test, Ur: NEGATIVE

## 2013-07-01 NOTE — Assessment & Plan Note (Addendum)
Will check TSH.  Urine HCG is negative. Refer to gyn.

## 2013-07-01 NOTE — Progress Notes (Signed)
  Subjective:    Patient ID: Stefanie Hale, female    DOB: 1980/11/14, 32 y.o.   MRN: [id number]  HPI  Stefanie Hale is a 32 yr old female who presents today to discuss irregular menstrual bleeding.  Had normal menses 10/10-10/14.  She has continued to have vaginal spotting since Friday. Reports that last month she had one day of spotting mid cycle.  Reports that the week following she had 1 day of nipple soreness.  Reports that soreness resolved after 1 day.  Had normal pap smear last year. Denies any current vaginal discharge or concern re: std exposure. She is not currently using birth control. She is sexually active. In the past she has been on yaz.     Review of Systems See HPI  Past Medical History  Diagnosis Date  . Childhood asthma     History   Social History  . Marital Status: Single    Spouse Name: N/A    Number of Children: N/A  . Years of Education: N/A   Occupational History  . Not on file.   Social History Main Topics  . Smoking status: Never Smoker   . Smokeless tobacco: Never Used  . Alcohol Use: Yes  . Drug Use: No  . Sexual Activity: Not on file   Other Topics Concern  . 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 No past surgeries      denies surgical history    Family History  Problem Relation Age of Onset  . Arthritis    . Hypertension    . Stroke      Allergies  Allergen Reactions  . Influenza Vaccines Other (See Comments)    Arm swelling and felt very bad.  . Penicillins     REACTION: hives    No current outpatient prescriptions on file prior to visit.   No current facility-administered medications on file prior to visit.    BP  118/80  Pulse 79  Temp(Src) 98.7 F (37.1 C) (Oral)  Resp 16  Ht 5\' 7"  (1.702 m)  Wt 205 lb (92.987 kg)  BMI 32.1 kg/m2  SpO2 98%  LMP 06/25/2013       Objective:   Physical Exam  Constitutional: She is oriented to person, place, and time. She appears well-developed and well-nourished. No distress.  HENT:  Head: Normocephalic and atraumatic.  Cardiovascular: Normal rate and regular rhythm.   No murmur heard. Pulmonary/Chest: Effort normal and breath sounds normal. No respiratory distress. She has no wheezes. She has no rales. She exhibits no tenderness.  Abdominal: Soft. Bowel sounds are normal. She exhibits no distension and no mass. There is no tenderness. There is no rebound and no guarding.  Musculoskeletal: She exhibits no edema.  Neurological: She is alert and oriented to person, place, and time.  Psychiatric: She has a normal mood and affect. Her behavior is normal. Judgment and thought content normal.          Assessment & Plan:

## 2013-07-01 NOTE — Patient Instructions (Signed)
Please complete lab work prior to leaving. You will be contacted about your referral to GYN.  

## 2013-07-01 NOTE — Addendum Note (Signed)
Addended by: Mervin Kung A on: 07/01/2013 05:13 PM   Modules accepted: Orders

## 2013-07-01 NOTE — Assessment & Plan Note (Signed)
She does not wish to resume OCP.  She is interested in another form of birth control- possibly IUD.  Will refer to GYN for further evaluation.

## 2013-07-02 ENCOUNTER — Encounter: Payer: Self-pay | Admitting: Family

## 2013-07-02 LAB — TSH: TSH: 1.205 u[IU]/mL (ref 0.350–4.500)

## 2013-07-25 ENCOUNTER — Ambulatory Visit (INDEPENDENT_AMBULATORY_CARE_PROVIDER_SITE_OTHER): Payer: BC Managed Care – PPO | Admitting: Physician Assistant

## 2013-07-25 ENCOUNTER — Encounter: Payer: Self-pay | Admitting: Physician Assistant

## 2013-07-25 VITALS — BP 122/80 | HR 78 | Temp 98.7°F | Ht 67.0 in | Wt 209.0 lb

## 2013-07-25 DIAGNOSIS — J329 Chronic sinusitis, unspecified: Secondary | ICD-10-CM

## 2013-07-25 MED ORDER — AZITHROMYCIN 250 MG PO TABS: ORAL_TABLET | ORAL | Status: DC

## 2013-07-25 NOTE — Progress Notes (Signed)
Pre visit review using our clinic review tool, if applicable. No additional management support is needed unless otherwise documented below in the visit note. 

## 2013-07-25 NOTE — Progress Notes (Signed)
Patient ID: Stefanie Hale, female   DOB: Mar 04, 1981, 32 y.o.   MRN: [id number]  Patient presents to clinic today c/o sinus pressure, pain, nasal congestion and drainage x 8-9 days.  Patient denies fever, chills.  Endorses mild, dry cough.  Denies shortness of breath or wheezing.  Denies seasonal allergies.  Does endorse childhood asthma but no symptoms in adulthood.  Has not taken anything for symptoms.   Past Medical History  Diagnosis Date  . Childhood asthma     No current outpatient prescriptions on file prior to visit.   No current facility-administered medications on file prior to visit.    Allergies  Allergen Reactions  . Influenza Vaccines Other (See Comments)    Arm swelling and felt very bad.  . Penicillins     REACTION: hives    Family History  Problem Relation Age of Onset  . Arthritis    . Hypertension    . Stroke      History   Social History  . Marital Status: Single    Spouse Name: N/A    Number of Children: N/A  . Years of Education: N/A   Social History Main Topics  . Smoking status: Never Smoker   . Smokeless tobacco: Never Used  . Alcohol Use: Yes  . Drug Use: No  . Sexual Activity: None   Other Topics Concern  . 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  All other ROS are negative.   Filed Vitals:   07/25/13 1557  BP: 122/80  Pulse: 78  Temp: 98.7 F (37.1 C)   Physical Exam  Vitals reviewed. Constitutional: She is oriented to person, place, and time and well-developed, well-nourished, and in no distress.  HENT:  Head: Normocephalic and atraumatic.  Right Ear: External ear normal.  Left Ear: External ear normal.  Nose: Nose normal.  Mouth/Throat: Oropharynx is clear and moist.  No oropharyngeal exudate.  Tympanic membranes within normal limits bilaterally.  Tenderness to percussion of bilateral maxillary sinuses on exam.  Eyes: Conjunctivae are normal.  Neck: Neck supple.  Cardiovascular: Normal rate, regular rhythm, normal heart sounds and intact distal pulses.   Pulmonary/Chest: Breath sounds normal. No respiratory distress. She has no wheezes. She has no rales. She exhibits no tenderness.  Lymphadenopathy:    She has no cervical adenopathy.  Neurological: She is alert and oriented to person, place, and time.  Skin: Skin is warm and dry. No rash noted.  Psychiatric: Affect normal.   Recent Results (from the past 2160 hour(s))  TSH     Status: None   Collection Time    07/01/13  3:30 PM      Result Value Range   TSH 1.205  0.350 - 4.500 uIU/mL  POCT URINE HCG BY VISUAL COLOR COMPARISON TESTS     Status: None   Collection Time    07/01/13  5:12 PM      Result Value Range   Preg Test, Ur Negative     Assessment/Plan: Sinusitis Rx Azithromycin.  Increase fluids.  Rest.  Saline nasal spray.  Humidifier in bedroom.  Delsym or other  OTC med for cough.

## 2013-07-25 NOTE — Assessment & Plan Note (Signed)
Rx Azithromycin.  Increase fluids.  Rest.  Saline nasal spray.  Humidifier in bedroom.  Delsym or other OTC med for cough.

## 2013-07-25 NOTE — Patient Instructions (Signed)
Please take antibiotic as prescribed.  Please use saline nasal spray daily.  Increase fluid intake.  Get plenty of rest.  Place a humidifier in the bedroom.  Can continue over the counter medications as needed.  Consider a daily claritin.  Please call or return to clinic if symptoms are not improving.

## 2013-07-26 ENCOUNTER — Encounter: Payer: Self-pay | Admitting: *Deleted

## 2013-08-04 ENCOUNTER — Emergency Department (HOSPITAL_BASED_OUTPATIENT_CLINIC_OR_DEPARTMENT_OTHER): Payer: BC Managed Care – PPO

## 2013-08-04 ENCOUNTER — Emergency Department (HOSPITAL_BASED_OUTPATIENT_CLINIC_OR_DEPARTMENT_OTHER)
Admission: EM | Admit: 2013-08-04 | Discharge: 2013-08-04 | Disposition: A | Discharge status: Home / Self Care | Payer: BC Managed Care – PPO | Attending: Emergency Medicine | Admitting: Emergency Medicine

## 2013-08-04 ENCOUNTER — Encounter (HOSPITAL_BASED_OUTPATIENT_CLINIC_OR_DEPARTMENT_OTHER): Payer: Self-pay | Admitting: Emergency Medicine

## 2013-08-04 DIAGNOSIS — Z880 Allergy status to penicillin: Secondary | ICD-10-CM | POA: Insufficient documentation

## 2013-08-04 DIAGNOSIS — J45901 Unspecified asthma with (acute) exacerbation: Secondary | ICD-10-CM | POA: Insufficient documentation

## 2013-08-04 DIAGNOSIS — J40 Bronchitis, not specified as acute or chronic: Secondary | ICD-10-CM

## 2013-08-04 MED ORDER — ALBUTEROL SULFATE HFA 108 (90 BASE) MCG/ACT IN AERS
2.0000 | INHALATION_SPRAY | RESPIRATORY_TRACT | Status: DC | PRN
  Administered 2013-08-04: 2 via RESPIRATORY_TRACT
  Filled 2013-08-04: qty 6.7

## 2013-08-04 NOTE — ED Provider Notes (Signed)
CSN: [id number]     Arrival date & time 08/04/13  1209 History   First MD Initiated Contact with Patient 08/04/13 1221     Chief Complaint  Patient presents with  . Nasal Congestion   (Consider location/radiation/quality/duration/timing/severity/associated sxs/prior Treatment) HPI Comments: Patient presents with cough and congestion. She states it started about 3 weeks ago. She's had some nasal congestion postnasal drip and coughing. She took a course of antibiotics recently with no improvement in symptoms. She states her cough seems to be getting worse and she feels some pain in the center of her chest. It is worse with coughing. She denies any shortness of breath. She denies any leg pain or swelling. She has a history of childhood asthma but hasn't had issues with a long time. She denies any recent fevers   Past Medical History  Diagnosis Date  . Childhood asthma    Past Surgical History  Procedure Laterality Date  . No past surgeries      denies surgical history   Family History  Problem Relation Age of Onset  . Arthritis    . Hypertension    . Stroke     History  Substance Use Topics  . Smoking status: Never Smoker   . Smokeless tobacco: Never Used  . Alcohol Use: Yes   OB History   Grav Para Term Preterm Abortions TAB SAB Ect Mult Living                 Review of Systems  Constitutional: Negative for fever, chills, diaphoresis and fatigue.  HENT: Positive for congestion, postnasal drip, rhinorrhea and sinus pressure. Negative for sneezing.   Eyes: Negative.   Respiratory: Positive for cough. Negative for chest tightness and shortness of breath.   Cardiovascular: Positive for chest pain. Negative for leg swelling.  Gastrointestinal: Negative for nausea, vomiting, abdominal pain, diarrhea and blood in stool.  Genitourinary: Negative for frequency, hematuria, flank pain and difficulty urinating.  Musculoskeletal: Negative for arthralgias and back pain.  Skin: Negative  for rash.  Neurological: Negative for dizziness, speech difficulty, weakness, numbness and headaches.    Allergies  Influenza vaccines and Penicillins  Home Medications   Current Outpatient Rx  Name  Route  Sig  Dispense  Refill  . azithromycin (ZITHROMAX) 250 MG tablet      Take 2 tablets on day 1.  Then take 1 tablet daily until all pills are gone.   6 tablet   0    BP 133/78  Pulse 75  Temp(Src) 97.9 F (36.6 C) (Oral)  Resp 18  SpO2 100%  LMP 07/14/2013 Physical Exam  Constitutional: She is oriented to person, place, and time. She appears well-developed and well-nourished.  HENT:  Head: Normocephalic and atraumatic.  Right Ear: External ear normal.  Left Ear: External ear normal.  Mouth/Throat: Oropharynx is clear and moist.  Eyes: Pupils are equal, round, and reactive to light.  Neck: Normal range of motion. Neck supple.  Cardiovascular: Normal rate, regular rhythm and normal heart sounds.   Pulmonary/Chest: Effort normal. No respiratory distress. She has wheezes (. Mild expiratory wheezes bilaterally. No increased work of breathing.). She has no rales. She exhibits tenderness (Positive tenderness overlying the sternum).  Abdominal: Soft. Bowel sounds are normal. There is no tenderness. There is no rebound and no guarding.  Musculoskeletal: Normal range of motion. She exhibits no edema.  No calf tenderness   Lymphadenopathy:    She has no cervical adenopathy.  Neurological: She is alert and  oriented to person, place, and time.  Skin: Skin is warm and dry. No rash noted.  Psychiatric: She has a normal mood and affect.    ED Course  Procedures (including critical care time) Labs Review Labs Reviewed - No data to display Imaging Review Dg Chest 2 View  08/04/2013   CLINICAL DATA:  Cough and congestion.  EXAM: CHEST  2 VIEW  COMPARISON:  Chest x-ray 06/09/2010.  FINDINGS: Lung volumes are normal. No consolidative airspace disease. No pleural effusions. No  pneumothorax. No pulmonary nodule or mass noted. Pulmonary vasculature and the cardiomediastinal silhouette are within normal limits.  IMPRESSION: 1.  No radiographic evidence of acute cardiopulmonary disease.   Electronically Signed   By: Trudie Reed M.D.   On: 08/04/2013 12:51    EKG Interpretation   None       MDM   1. Bronchitis    Patient is well-appearing, afebrile.  No evidence of pneumonia. Her symptoms are not consistent with pulmonary embolus. Her oxygen saturation is normal. She was given an albuterol MDI to use at home and advised in symptomatic care. She was advised to followup with her primary care physician if her symptoms are not improving within next few days.   Rolan Bucco, MD 08/04/13 1340

## 2013-08-04 NOTE — ED Notes (Signed)
Patient here with 3 weeks of cough, congestion and intermittent sore throat. Took antibiotic with no relief

## 2016-01-20 ENCOUNTER — Telehealth: Payer: Self-pay | Admitting: Behavioral Health

## 2016-01-20 NOTE — Telephone Encounter (Signed)
Unable to reach patient at time of Pre-Visit Call. Per recording, the patient has a voice mailbox that has not been set up.

## 2016-01-21 ENCOUNTER — Encounter: Payer: Self-pay | Admitting: Family Medicine

## 2016-01-21 ENCOUNTER — Telehealth: Payer: Self-pay | Admitting: Family Medicine

## 2016-01-21 NOTE — Progress Notes (Signed)
Pre visit review using our clinic tool,if applicable. No additional management support is needed unless otherwise documented below in the visit note.  

## 2016-02-01 NOTE — Telephone Encounter (Signed)
No charge is fine 

## 2016-02-01 NOTE — Telephone Encounter (Signed)
Pt was no show 01/21/16 for new pt appt, pt has rescheduled for 02/25/16, 1st no show, charge or no charge? Reschedule with you if pt calls?

## 2016-02-24 ENCOUNTER — Telehealth: Payer: Self-pay | Admitting: *Deleted

## 2016-02-24 NOTE — Telephone Encounter (Signed)
Unable to reach patient at time of Pre-Visit Call. No answer and no voicemail.

## 2016-02-25 ENCOUNTER — Ambulatory Visit: Payer: Self-pay | Admitting: Family Medicine

## 2016-03-29 ENCOUNTER — Telehealth: Payer: Self-pay | Admitting: *Deleted

## 2016-03-29 NOTE — Telephone Encounter (Signed)
Unable to reach patient at time of Pre-Visit Call. The number on file is no longer in service.

## 2016-03-30 ENCOUNTER — Encounter: Payer: Self-pay | Admitting: Family Medicine

## 2016-03-30 ENCOUNTER — Other Ambulatory Visit (HOSPITAL_COMMUNITY)
Admission: RE | Admit: 2016-03-30 | Discharge: 2016-03-30 | Disposition: A | Discharge status: Home / Self Care | Payer: BLUE CROSS/BLUE SHIELD | Source: Ambulatory Visit | Attending: Family Medicine | Admitting: Family Medicine

## 2016-03-30 ENCOUNTER — Telehealth: Payer: Self-pay | Admitting: *Deleted

## 2016-03-30 ENCOUNTER — Ambulatory Visit (INDEPENDENT_AMBULATORY_CARE_PROVIDER_SITE_OTHER): Payer: BLUE CROSS/BLUE SHIELD | Admitting: Family Medicine

## 2016-03-30 VITALS — BP 134/82 | HR 75 | Temp 98.9°F | Ht 67.0 in | Wt 209.4 lb

## 2016-03-30 DIAGNOSIS — Z1151 Encounter for screening for human papillomavirus (HPV): Secondary | ICD-10-CM | POA: Insufficient documentation

## 2016-03-30 DIAGNOSIS — Z113 Encounter for screening for infections with a predominantly sexual mode of transmission: Secondary | ICD-10-CM

## 2016-03-30 DIAGNOSIS — N390 Urinary tract infection, site not specified: Secondary | ICD-10-CM

## 2016-03-30 DIAGNOSIS — Z01419 Encounter for gynecological examination (general) (routine) without abnormal findings: Secondary | ICD-10-CM | POA: Insufficient documentation

## 2016-03-30 DIAGNOSIS — Z124 Encounter for screening for malignant neoplasm of cervix: Secondary | ICD-10-CM

## 2016-03-30 DIAGNOSIS — N926 Irregular menstruation, unspecified: Secondary | ICD-10-CM

## 2016-03-30 LAB — POC URINALSYSI DIPSTICK (AUTOMATED)
Bilirubin, UA: NEGATIVE
Blood, UA: 2
Nitrite, UA: NEGATIVE
PROTEIN UA: NEGATIVE
Urobilinogen, UA: NEGATIVE
pH, UA: 6

## 2016-03-30 LAB — POCT URINE PREGNANCY: Preg Test, Ur: NEGATIVE

## 2016-03-30 LAB — CBC
HCT: 35.1 % — ABNORMAL LOW (ref 36.0–46.0)
HEMOGLOBIN: 11.6 g/dL — AB (ref 12.0–15.0)
MCHC: 33.1 g/dL (ref 30.0–36.0)
MCV: 91.4 fl (ref 78.0–100.0)
PLATELETS: 288 10*3/uL (ref 150.0–400.0)
RBC: 3.84 Mil/uL — ABNORMAL LOW (ref 3.87–5.11)
RDW: 13.1 % (ref 11.5–15.5)
WBC: 4.9 10*3/uL (ref 4.0–10.5)

## 2016-03-30 LAB — TSH: TSH: 1.87 u[IU]/mL (ref 0.35–4.50)

## 2016-03-30 NOTE — Telephone Encounter (Signed)
Caller: [facility] #[phone]  They received a GC/chlamydia swab for pt, but no orders for this swab. Please advise if you would like any additional tests ordered.

## 2016-03-30 NOTE — Patient Instructions (Addendum)
I will be in touch with your pap and other labs asap- if your bleeding gets worse or if you have any other symptoms please let me know For now, start on ibuprofen 400 mg three times a day until your bleeding is resolved.  If your bleeding is not stopped within one week please contact me!   While you are taking the ibuprofen do not take other NSAID medication or midol

## 2016-03-30 NOTE — Progress Notes (Signed)
Pre visit review using our clinic review tool, if applicable. No additional management support is needed unless otherwise documented below in the visit note. 

## 2016-03-30 NOTE — Progress Notes (Addendum)
[facility] [address] [place], [place] [address] [phone]  Date:  03/30/2016   Name:  Stefanie Hale   DOB:  1981-04-12   MRN:  [id number]  PCP:  Lamar Blinks, MD    Chief Complaint: Establish Care   History of Present Illness:  Stefanie Hale is a 35 y.o. very pleasant female patient who presents with the following:  Here today as a new patient with concern about abnormal vaginal bleeding.  her menses started as usual about 9 days ago, but she was bleeding more and having more cramps than she typically does.  She has not stopped bleeding since- Generally her menses last 4-5 days.   She has noted a lower back ache but no abd pain.  Able to eat ok- no nausea, vomiting, fever or chills She did think that she was starting to get a UTI about a week prior to menses as she noted dysuria- she took some Azo.  These sx are now resolved. No current dysuria.  She cannot determine if hematuria due to vaginal bleeding  She is not on any medictaions   She has been SA with the same female partner for a couple of years.   Her last pap was about 4 years ago.  Patient Active Problem List   Diagnosis Date Noted  . Sinusitis 07/25/2013  . Irregular menses 07/01/2013  . Unspecified contraceptive management 07/01/2013  . Back pain 01/01/2012  . Rash 12/28/2011  . Vaginal discharge 12/28/2011  . Anemia 12/28/2011  . Cervical cancer screening 12/28/2011  . Annual physical exam 11/17/2011  . SOMNOLENCE 11/24/2010  . EPISTAXIS, RECURRENT 11/24/2010    Past Medical History  Diagnosis Date  . Childhood asthma     Past Surgical History  Procedure Laterality Date  . No past surgeries      denies surgical history    Social History  Substance Use Topics  . Smoking status: Never Smoker   . Smokeless tobacco: Never Used  . Alcohol Use: Yes    Family History  Problem Relation Age of Onset  . Arthritis    .  Hypertension    . Stroke      Allergies  Allergen Reactions  . Influenza Vaccines Other (See Comments)    Arm swelling and felt very bad.  . Penicillins     REACTION: hives    Medication list has been reviewed and updated.  No current outpatient prescriptions on file prior to visit.   No current facility-administered medications on file prior to visit.    Review of Systems:  As per HPI- otherwise negative.   Physical Examination: Filed Vitals:   03/30/16 1324  BP: 134/82  Pulse: 75  Temp: 98.9 F (37.2 C)   Filed Vitals:   03/30/16 1324  Height: 5\' 7"  (1.702 m)  Weight: 209 lb 6.4 oz (94.983 kg)   Body mass index is 32.79 kg/(m^2). Ideal Body Weight: Weight in (lb) to have BMI = 25: 159.3  GEN: WDWN, NAD, Non-toxic, A & O x 3, obese, looks well HEENT: Atraumatic, Normocephalic. Neck supple. No masses, No LAD. Ears and Nose: No external deformity. CV: RRR, No M/G/R. No JVD. No thrill. No extra heart sounds. PULM: CTA B, no wheezes, crackles, rhonchi. No retractions. No resp. distress. No accessory muscle use. ABD: S, NT, ND, +BS. No rebound. No HSM.  Benign belly, no CVA tenderness EXTR: No c/c/e NEURO Normal gait.  PSYCH: Normally interactive. Conversant. Not depressed or anxious appearing.  Calm demeanor.  Pelvic: normal, no vaginal lesions or discharge. Uterus normal, no CMT, no adnexal tendereness or masses Noted small polyp at cervical os.  She has mild bleeding from the cervix.  Pap collected today, GC/ CMZ also   Results for orders placed or performed in visit on 03/30/16  CBC  Result Value Ref Range   WBC 4.9 4.0 - 10.5 K/uL   RBC 3.84 (L) 3.87 - 5.11 Mil/uL   Platelets 288.0 150.0 - 400.0 K/uL   Hemoglobin 11.6 (L) 12.0 - 15.0 g/dL   HCT 35.1 (L) 36.0 - 46.0 %   MCV 91.4 78.0 - 100.0 fl   MCHC 33.1 30.0 - 36.0 g/dL   RDW 13.1 11.5 - 15.5 %  TSH  Result Value Ref Range   TSH 1.87 0.35 - 4.50 uIU/mL  POCT Urinalysis Dipstick (Automated)  Result  Value Ref Range   Color, UA Yellow    Clarity, UA Clear    Glucose, UA trace    Bilirubin, UA negative    Ketones, UA 3+    Spec Grav, UA >=1.030    Blood, UA 2    pH, UA 6.0    Protein, UA negative    Urobilinogen, UA negative    Nitrite, UA negative    Leukocytes, UA moderate (2+) (A) Negative  POCT urine pregnancy  Result Value Ref Range   Preg Test, Ur Negative Negative    Assessment and Plan: Irregular bleeding - Plan: POCT urine pregnancy, CBC, TSH  Urinary tract infection, site not specified - Plan: POCT Urinalysis Dipstick (Automated), Urine culture  Screening for cervical cancer - Plan: Cytology - PAP  Here today with prolonged vaginal bleeding.  Discussed options- offered to start on OCP or other hormone therapy to stop bleeding vs trial of NSAIDs.  She prefers to try NSAIDs which is reasonable Await pap and STI testing, urine culture Polyp on cervix- suspect benign. We do not know if this is new.  Await pap and follow-up as needed   I will be in touch with your pap and other labs asap- if your bleeding gets worse or if you have any other symptoms please let me know For now, start on ibuprofen 400 mg three times a day until your bleeding is resolved.  If your bleeding is not stopped within one week please contact me!   While you are taking the ibuprofen do not take other NSAID medication or midol  Signed Lamar Blinks, MD  Called her 7/27 as she continues to have bleeding- did not reach, LMOM.  Put in referral to OBG for her now

## 2016-03-30 NOTE — Telephone Encounter (Signed)
Called the lab and advised we do want this test-  Added order

## 2016-03-31 ENCOUNTER — Encounter: Payer: Self-pay | Admitting: Family Medicine

## 2016-03-31 LAB — CERVICOVAGINAL ANCILLARY ONLY
CHLAMYDIA, DNA PROBE: NEGATIVE
Neisseria Gonorrhea: NEGATIVE

## 2016-03-31 LAB — CYTOLOGY - PAP

## 2016-04-01 LAB — URINE CULTURE

## 2016-04-07 ENCOUNTER — Other Ambulatory Visit: Payer: Self-pay | Admitting: Family Medicine

## 2016-04-07 DIAGNOSIS — N926 Irregular menstruation, unspecified: Secondary | ICD-10-CM

## 2016-04-07 MED ORDER — LEVONORGESTREL-ETHINYL ESTRAD 0.1-20 MG-MCG PO TABS: 1.0000 | ORAL_TABLET | Freq: Every day | ORAL | 11 refills | Status: DC

## 2016-04-07 NOTE — Addendum Note (Signed)
Addended by: Lamar Blinks C on: 04/07/2016 01:07 PM   Modules accepted: Orders

## 2016-04-07 NOTE — Telephone Encounter (Signed)
Called pt- she would like to try OCP to stop her bleeding.  We did a negative HCG at our last visit and she has not had intercourse since then.  She does not smoke, no history of HTN, PE, DVT, or migraine with aura.  She has used OCP in the past and did ok.   Will have her start OCP one daily- however it this fails to stop her bleeding in 48 hours she will let me know and I will have her increase her # of pills daily. OBG referral placed  She will keep me posted  BP Readings from Last 3 Encounters:  03/30/16 134/82  08/04/13 133/78  07/25/13 122/80

## 2016-04-28 ENCOUNTER — Ambulatory Visit (INDEPENDENT_AMBULATORY_CARE_PROVIDER_SITE_OTHER): Payer: BLUE CROSS/BLUE SHIELD | Admitting: Family Medicine

## 2016-04-28 ENCOUNTER — Encounter: Payer: Self-pay | Admitting: Family Medicine

## 2016-04-28 VITALS — BP 123/75 | HR 63 | Resp 16 | Ht 67.0 in | Wt 208.0 lb

## 2016-04-28 DIAGNOSIS — N939 Abnormal uterine and vaginal bleeding, unspecified: Secondary | ICD-10-CM

## 2016-04-28 NOTE — Progress Notes (Signed)
   Subjective:    Patient ID: Stefanie Hale, female    DOB: [birth year], 35 y.o.   MRN: [id number]  HPI This is a 35 year old G2 P2 who was referred to [place] by her primary care physician due to abnormal uterine bleeding. Patient has always had menses once a month, although varies between 26 and 30+ days. Flow lasts for approximately 4 days and is generally fairly moderate. The patient saw her PCP on 7/19 for an annual care, but had 9 days of bleeding which was heavier and with more cramping than usual. She was prescribed OCPs, which she began taking, with gradual improvement of her bleeding over the first week. She had no bleeding during the middle 2 weeks, and now is having her period again on the placebo week. She did just start working third shift.  I have reviewed the patients past medical, family, and social history.  I have reviewed the patient's medication list and allergies.  Review of Systems  Constitutional: Negative for chills, fatigue and fever.  Gastrointestinal: Negative for abdominal pain, diarrhea and nausea.  Genitourinary: Negative for pelvic pain, vaginal discharge and vaginal pain.       Objective:   Physical Exam  Constitutional: She is oriented to person, place, and time. She appears well-developed and well-nourished.  HENT:  Head: Normocephalic and atraumatic.  Cardiovascular: Normal rate, regular rhythm and normal heart sounds.   Pulmonary/Chest: Effort normal and breath sounds normal. No respiratory distress. She has no wheezes. She has no rales. She exhibits no tenderness.  Neurological: She is alert and oriented to person, place, and time.  Skin: Skin is warm and dry.      Assessment & Plan:  1. Abnormal uterine bleeding (AUB) Continue OCPs.  CBC and TSH done by PCP normal.  Likely stress induced.  Will follow up with Pt in 2 months to assure no continued bleeding.

## 2016-05-05 ENCOUNTER — Encounter: Payer: Self-pay | Admitting: Family Medicine

## 2016-05-18 ENCOUNTER — Ambulatory Visit: Payer: BLUE CROSS/BLUE SHIELD | Admitting: Physician Assistant

## 2016-05-19 ENCOUNTER — Encounter: Payer: Self-pay | Admitting: Family Medicine

## 2016-06-30 ENCOUNTER — Ambulatory Visit: Payer: BLUE CROSS/BLUE SHIELD | Admitting: Family Medicine

## 2016-08-26 ENCOUNTER — Encounter (HOSPITAL_BASED_OUTPATIENT_CLINIC_OR_DEPARTMENT_OTHER): Payer: Self-pay | Admitting: *Deleted

## 2016-08-26 ENCOUNTER — Emergency Department (HOSPITAL_BASED_OUTPATIENT_CLINIC_OR_DEPARTMENT_OTHER)
Admission: EM | Admit: 2016-08-26 | Discharge: 2016-08-26 | Disposition: A | Discharge status: Home / Self Care | Payer: BLUE CROSS/BLUE SHIELD | Attending: Emergency Medicine | Admitting: Emergency Medicine

## 2016-08-26 ENCOUNTER — Emergency Department (HOSPITAL_BASED_OUTPATIENT_CLINIC_OR_DEPARTMENT_OTHER): Payer: BLUE CROSS/BLUE SHIELD

## 2016-08-26 DIAGNOSIS — Y9389 Activity, other specified: Secondary | ICD-10-CM | POA: Diagnosis not present

## 2016-08-26 DIAGNOSIS — Y92410 Unspecified street and highway as the place of occurrence of the external cause: Secondary | ICD-10-CM | POA: Diagnosis not present

## 2016-08-26 DIAGNOSIS — J45909 Unspecified asthma, uncomplicated: Secondary | ICD-10-CM | POA: Insufficient documentation

## 2016-08-26 DIAGNOSIS — S8991XA Unspecified injury of right lower leg, initial encounter: Secondary | ICD-10-CM | POA: Diagnosis present

## 2016-08-26 DIAGNOSIS — S8011XA Contusion of right lower leg, initial encounter: Secondary | ICD-10-CM | POA: Diagnosis not present

## 2016-08-26 DIAGNOSIS — Y999 Unspecified external cause status: Secondary | ICD-10-CM | POA: Insufficient documentation

## 2016-08-26 MED ORDER — IBUPROFEN 800 MG PO TABS: 800.0000 mg | ORAL_TABLET | Freq: Four times a day (QID) | ORAL | 0 refills | Status: AC

## 2016-08-26 MED ORDER — IBUPROFEN 800 MG PO TABS
800.0000 mg | ORAL_TABLET | Freq: Once | ORAL | Status: AC
Start: 2016-08-26 — End: 2016-08-26
  Administered 2016-08-26: 800 mg via ORAL
  Filled 2016-08-26: qty 1

## 2016-08-26 MED FILL — IBUPROFEN 800 MG TABLET: 800 | 5 days supply | Qty: 20 | Fill #0
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
## 2016-08-26 NOTE — ED Notes (Signed)
Ice pack given

## 2016-08-29 ENCOUNTER — Encounter: Payer: Self-pay | Admitting: Family Medicine

## 2016-08-29 ENCOUNTER — Ambulatory Visit: Payer: BLUE CROSS/BLUE SHIELD | Admitting: Family

## 2016-08-29 ENCOUNTER — Ambulatory Visit (INDEPENDENT_AMBULATORY_CARE_PROVIDER_SITE_OTHER): Payer: Self-pay | Admitting: Family Medicine

## 2016-08-29 ENCOUNTER — Ambulatory Visit (HOSPITAL_BASED_OUTPATIENT_CLINIC_OR_DEPARTMENT_OTHER)
Admission: RE | Admit: 2016-08-29 | Discharge: 2016-08-29 | Disposition: A | Discharge status: Home / Self Care | Payer: BLUE CROSS/BLUE SHIELD | Source: Ambulatory Visit | Attending: Family Medicine | Admitting: Family Medicine

## 2016-08-29 VITALS — BP 111/78 | HR 66 | Temp 98.7°F | Resp 20 | Wt 197.5 lb

## 2016-08-29 DIAGNOSIS — T148XXA Other injury of unspecified body region, initial encounter: Secondary | ICD-10-CM

## 2016-08-29 DIAGNOSIS — S8011XA Contusion of right lower leg, initial encounter: Secondary | ICD-10-CM | POA: Insufficient documentation

## 2016-08-29 DIAGNOSIS — R202 Paresthesia of skin: Secondary | ICD-10-CM

## 2016-08-29 MED ORDER — TRAMADOL HCL 50 MG PO TABS: 50.0000 mg | ORAL_TABLET | Freq: Three times a day (TID) | ORAL | 0 refills | Status: DC | PRN
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
## 2016-08-29 NOTE — Patient Instructions (Addendum)
Tramadol prescribed for pain, use this for moderate to severe pain.  Monitor for decreased circulation, if experience please go to ED immediately.   I have ordered and [place] of your leg. This will be completed at 5:30 pm today at [facility].  If they see a blood clot, they will provide you treatment through the ED. Otherwise you will hear from me in the morning

## 2016-08-30 ENCOUNTER — Telehealth: Payer: Self-pay | Admitting: Family Medicine

## 2016-08-30 NOTE — Telephone Encounter (Signed)
Spoke with patient reviewed results and instructions . Patient verbalized understanding. 

## 2016-08-30 NOTE — Telephone Encounter (Signed)
Please call pt: - her Doppler US did not show evidence of a blood clot.  - she should continue the motrin, and use tramadol provided for moderate to sever pain,not controlled by motrin.  - keep leg elevated when able and wrapped with ACE bandage.  - Monitor for any circulatory issues or signs of infection (color change, temp change, increased pain, drainage, fever etc.) - seek immediate treatment of occurs.  - F/U with PCP

## 2016-09-01 ENCOUNTER — Encounter: Payer: Self-pay | Admitting: Family Medicine

## 2016-09-07 ENCOUNTER — Ambulatory Visit (INDEPENDENT_AMBULATORY_CARE_PROVIDER_SITE_OTHER): Payer: BLUE CROSS/BLUE SHIELD | Admitting: Family Medicine

## 2016-09-07 ENCOUNTER — Encounter: Payer: Self-pay | Admitting: Family Medicine

## 2016-09-07 DIAGNOSIS — S8011XD Contusion of right lower leg, subsequent encounter: Secondary | ICD-10-CM | POA: Diagnosis not present
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
## 2016-09-07 NOTE — Progress Notes (Signed)
Pre visit review using our clinic review tool, if applicable. No additional management support is needed unless otherwise documented below in the visit note. 
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
## 2016-09-21 ENCOUNTER — Encounter: Payer: Self-pay | Admitting: Family Medicine

## 2016-10-04 ENCOUNTER — Encounter: Payer: Self-pay | Admitting: Family Medicine

## 2016-10-06 ENCOUNTER — Ambulatory Visit (HOSPITAL_BASED_OUTPATIENT_CLINIC_OR_DEPARTMENT_OTHER)
Admission: RE | Admit: 2016-10-06 | Discharge: 2016-10-06 | Disposition: A | Discharge status: Home / Self Care | Payer: BLUE CROSS/BLUE SHIELD | Source: Ambulatory Visit | Attending: Family Medicine | Admitting: Family Medicine

## 2016-10-06 ENCOUNTER — Ambulatory Visit (INDEPENDENT_AMBULATORY_CARE_PROVIDER_SITE_OTHER): Payer: BLUE CROSS/BLUE SHIELD | Admitting: Family Medicine

## 2016-10-06 VITALS — BP 112/63 | HR 84 | Temp 98.6°F | Ht 67.0 in | Wt 200.6 lb

## 2016-10-06 DIAGNOSIS — M79604 Pain in right leg: Secondary | ICD-10-CM
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
## 2016-10-06 NOTE — Patient Instructions (Signed)
It was good to see you today.  I am going to repeat your shin x-ray to make sure there is no sign of a stress fracture or other concerns I am also going to have you see Dr. Barbaraann Barthel with sports medicine to help [place] determine anything else that may help with your pain If you are getting worse or have any other concerns please let me know

## 2016-10-10 ENCOUNTER — Encounter: Payer: Self-pay | Admitting: Family Medicine

## 2016-10-10 DIAGNOSIS — M79604 Pain in right leg: Secondary | ICD-10-CM
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
## 2016-10-25 ENCOUNTER — Ambulatory Visit (INDEPENDENT_AMBULATORY_CARE_PROVIDER_SITE_OTHER): Payer: BLUE CROSS/BLUE SHIELD | Admitting: Family Medicine

## 2016-10-25 ENCOUNTER — Encounter: Payer: Self-pay | Admitting: Family Medicine

## 2016-10-25 ENCOUNTER — Ambulatory Visit: Payer: Self-pay

## 2016-10-25 VITALS — BP 126/72 | HR 64 | Ht 67.0 in | Wt 204.0 lb

## 2016-10-25 DIAGNOSIS — S86891A Other injury of other muscle(s) and tendon(s) at lower leg level, right leg, initial encounter: Secondary | ICD-10-CM | POA: Diagnosis not present

## 2016-10-25 DIAGNOSIS — M79604 Pain in right leg: Secondary | ICD-10-CM

## 2016-10-25 MED ORDER — VITAMIN D (ERGOCALCIFEROL) 1.25 MG (50000 UNIT) PO CAPS: 50000.0000 [IU] | ORAL_CAPSULE | ORAL | 0 refills | Status: DC

## 2016-10-25 NOTE — Patient Instructions (Signed)
Good to see you  Stefanie Hale is your friend. Ice 20 minutes 2 times daily. Usually after activity and before bed. Compression sleeve daily to the area.  We will put you on some restrictions at work for next 2 weeks.  Once weekly vitamin D for 12 weeks.  pennsaid pinkie amount topically 2 times daily as needed.  Arnica lotion 2 times a day can help as well See me again in 2-3 weeks.

## 2016-10-25 NOTE — Assessment & Plan Note (Signed)
I believe the patient does have more of a stress reaction but surely a stress fracture. Patient does have callus formation and show some to be slow. Started on once weekly vitamin D. Has tramadol for breakthrough pain. Discussed with her about the possibility of radiation from the back but patient denies any true back pain. We will put her on limited duty at work for short course. We discussed icing regimen. Topical anti-inflammatories given. Follow-up again in 2-3 weeks and we will advance her appropriately.
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
## 2016-11-08 ENCOUNTER — Ambulatory Visit: Payer: BLUE CROSS/BLUE SHIELD | Admitting: Family Medicine
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
## 2016-11-16 ENCOUNTER — Encounter: Payer: Self-pay | Admitting: Family Medicine

## 2016-11-16 ENCOUNTER — Ambulatory Visit (INDEPENDENT_AMBULATORY_CARE_PROVIDER_SITE_OTHER): Payer: BLUE CROSS/BLUE SHIELD | Admitting: Family Medicine

## 2016-11-16 DIAGNOSIS — S86891A Other injury of other muscle(s) and tendon(s) at lower leg level, right leg, initial encounter: Secondary | ICD-10-CM

## 2016-11-16 NOTE — Patient Instructions (Signed)
Good to see you  Alvera Singh is your friend.  Stay active We will put you in a boot for 2 weeks.  See em again in 2 weeks to get you out of the boot.

## 2016-11-16 NOTE — Assessment & Plan Note (Signed)
She continues to have what appears to be more of a stress reaction and bone contusion. Do not feel that further workup is necessary for patient will be put in a Cam Walker. I do believe that this seems to be almost worsening symptoms. Patient will continue on the vitamin D. We discussed icing regimen. We discussed range of motion exercises when she is out of the boot. Patient follow-up with me again in 3 weeks and we will advance accordingly.

## 2016-12-06 ENCOUNTER — Ambulatory Visit: Payer: Self-pay

## 2016-12-06 ENCOUNTER — Ambulatory Visit (INDEPENDENT_AMBULATORY_CARE_PROVIDER_SITE_OTHER): Payer: BLUE CROSS/BLUE SHIELD | Admitting: Family Medicine

## 2016-12-06 ENCOUNTER — Encounter: Payer: Self-pay | Admitting: Family Medicine

## 2016-12-06 VITALS — BP 116/80 | HR 84 | Ht 67.0 in | Wt 213.0 lb

## 2016-12-06 DIAGNOSIS — M79604 Pain in right leg: Secondary | ICD-10-CM | POA: Diagnosis not present

## 2016-12-06 DIAGNOSIS — S86891A Other injury of other muscle(s) and tendon(s) at lower leg level, right leg, initial encounter: Secondary | ICD-10-CM

## 2016-12-06 NOTE — Patient Instructions (Addendum)
Good to see you  Alvera Singh is your friend.  Lets start to increase activity out of the boot. Still wear boot if doing a lot of walking and at work for another 3 weeks.  Move that ankle around See me again in 3 weeks.

## 2016-12-06 NOTE — Assessment & Plan Note (Signed)
Patient is making progress. Continue the boot only at work. 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 Encourage her to continue the once weekly vitamin D, we discussed icing regimen and home exercises. We discussed transition into shoes. Follow-up again in 4 weeks.
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
## 2016-12-27 ENCOUNTER — Ambulatory Visit: Payer: BLUE CROSS/BLUE SHIELD | Admitting: Family Medicine

## 2016-12-27 ENCOUNTER — Ambulatory Visit (INDEPENDENT_AMBULATORY_CARE_PROVIDER_SITE_OTHER): Payer: BLUE CROSS/BLUE SHIELD | Admitting: Family Medicine

## 2016-12-27 ENCOUNTER — Encounter: Payer: Self-pay | Admitting: Family Medicine

## 2016-12-27 DIAGNOSIS — S86891A Other injury of other muscle(s) and tendon(s) at lower leg level, right leg, initial encounter: Secondary | ICD-10-CM | POA: Diagnosis not present

## 2016-12-27 NOTE — Patient Instructions (Addendum)
Good to see you  You should be doing well  Wear the compression at work and with a lot of activity  You have to wear good shoes no matter what! Avoid being barefoot.  AS long as you do well see me when you need me!
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
## 2016-12-27 NOTE — Progress Notes (Signed)
Pre-visit discussion using our clinic review tool. No additional management support is needed unless otherwise documented below in the visit note.  

## 2016-12-27 NOTE — Assessment & Plan Note (Signed)
I believe the patient is healed at this time. May increase activity as tolerated. Change into regular shoes. No significant restrictions. Compression sleeve given today that I think will help with some of the discomfort during her workday. Discuss the need for potential custom orthotics. Patient will follow-up me more on an as needed basis.

## 2017-01-09 ENCOUNTER — Ambulatory Visit: Payer: BLUE CROSS/BLUE SHIELD | Admitting: Family Medicine

## 2017-03-14 ENCOUNTER — Encounter: Payer: Self-pay | Admitting: Family Medicine

## 2017-03-14 DIAGNOSIS — N926 Irregular menstruation, unspecified: Secondary | ICD-10-CM

## 2017-03-14 MED ORDER — LEVONORGESTREL-ETHINYL ESTRAD 0.1-20 MG-MCG PO TABS: 1.0000 | ORAL_TABLET | Freq: Every day | ORAL | 3 refills | Status: DC

## 2017-06-27 ENCOUNTER — Ambulatory Visit (INDEPENDENT_AMBULATORY_CARE_PROVIDER_SITE_OTHER): Payer: BLUE CROSS/BLUE SHIELD | Admitting: Family Medicine

## 2017-06-27 ENCOUNTER — Encounter: Payer: Self-pay | Admitting: Family Medicine

## 2017-06-27 ENCOUNTER — Other Ambulatory Visit (HOSPITAL_COMMUNITY)
Admission: RE | Admit: 2017-06-27 | Discharge: 2017-06-27 | Disposition: A | Discharge status: Home / Self Care | Payer: BLUE CROSS/BLUE SHIELD | Source: Ambulatory Visit | Attending: Family Medicine | Admitting: Family Medicine

## 2017-06-27 ENCOUNTER — Telehealth: Payer: Self-pay | Admitting: Family Medicine

## 2017-06-27 VITALS — BP 119/80 | HR 71 | Temp 97.9°F | Resp 16 | Ht 67.0 in | Wt 208.5 lb

## 2017-06-27 DIAGNOSIS — N946 Dysmenorrhea, unspecified: Secondary | ICD-10-CM

## 2017-06-27 DIAGNOSIS — N939 Abnormal uterine and vaginal bleeding, unspecified: Secondary | ICD-10-CM | POA: Insufficient documentation

## 2017-06-27 DIAGNOSIS — Z8619 Personal history of other infectious and parasitic diseases: Secondary | ICD-10-CM | POA: Insufficient documentation

## 2017-06-27 LAB — CBC
HCT: 39.6 % (ref 36.0–46.0)
HEMOGLOBIN: 12.8 g/dL (ref 12.0–15.0)
MCHC: 32.5 g/dL (ref 30.0–36.0)
MCV: 95.1 fl (ref 78.0–100.0)
Platelets: 314 10*3/uL (ref 150.0–400.0)
RBC: 4.16 Mil/uL (ref 3.87–5.11)
RDW: 13.1 % (ref 11.5–15.5)
WBC: 4.8 10*3/uL (ref 4.0–10.5)

## 2017-06-27 LAB — POCT URINE PREGNANCY: PREG TEST UR: NEGATIVE

## 2017-06-27 MED ORDER — TRAMADOL HCL 50 MG PO TABS: ORAL_TABLET | ORAL | 0 refills | Status: DC

## 2017-06-27 NOTE — Patient Instructions (Signed)
Change your ibuprofen dosing to THREE of the OTC pills every 6 hours with food.  Take the tramadol if pain not controlled with ibuprofen.  Restart your birth control pills today.

## 2017-06-27 NOTE — Progress Notes (Signed)
OFFICE VISIT  06/27/2017   CC:  Chief Complaint  Patient presents with  . Menstrual Cramps   HPI:    Patient is a 36 y.o. African-American female who presents for severe menstrual cramping. Onset of menses about 4 days ago--about 1 week early.  Usually has regular intervals between menses since being on OCP for the last 12-15 months. Severe cramping, much worse than her normal.  Bleeding increased.  Going through 5 pads per day since onset, usually uses just 2 pads per day. She is sexually active, unprotected intercourse.  No recent new partner.  She reports hx of chlamydia about 9 yrs ago detected on prenatal testing--she was asymptomatic.  Currently denies vag d/c.  Taking 400mg  ibuprofen about q4h since onset of cramps/bleeding.  Not helping much at all.  NO dysuria, hematuria, urinary urgency, or urinary frequency.  Past Medical History:  Diagnosis Date  . Childhood asthma     Past Surgical History:  Procedure Laterality Date  . NO PAST SURGERIES     denies surgical history    Outpatient Medications Prior to Visit  Medication Sig Dispense Refill  . levonorgestrel-ethinyl estradiol (AVIANE,ALESSE,LESSINA) 0.1-20 MG-MCG tablet Take 1 tablet by mouth daily. 1 Package 3  . traMADol (ULTRAM) 50 MG tablet Take 1 tablet (50 mg total) by mouth every 8 (eight) hours as needed. (Patient not taking: Reported on 06/27/2017) 30 tablet 0  . Vitamin D, Ergocalciferol, (DRISDOL) 50000 units CAPS capsule Take 1 capsule (50,000 Units total) by mouth every 7 (seven) days. (Patient not taking: Reported on 06/27/2017) 12 capsule 0   No facility-administered medications prior to visit.     Allergies  Allergen Reactions  . Influenza Vaccines Other (See Comments)    Arm swelling and felt very bad.  . Penicillins     REACTION: hives    ROS As per HPI  PE: Blood pressure 119/80, pulse 71, temperature 97.9 F (36.6 C), temperature source Oral, resp. rate 16, height 5\' 7"  (1.702 m),  weight 208 lb 8 oz (94.6 kg), last menstrual period 06/24/2017, SpO2 100 %. Pt examined with Sharen Hones, CMA, as chaperone. Gen: Alert, well appearing.  Patient is oriented to person, place, time, and situation. AFFECT: pleasant, lucid thought and speech. Pelvic exam: Vagina and vulva are normal;  no discharge is noted.  Cervix normal without lesions, os closed but dark red blood is noted emerging from os. She has cervical motion tenderness and suprapubic tenderness on bimanual exam.   Adnexa normal in size without masses or tenderness. GC/Chl swab obtained from cervix.  Exam chaperoned by female assistant.   LABS:  Lab Results  Component Value Date   WBC 4.9 03/30/2016   HGB 11.6 (L) 03/30/2016   HCT 35.1 (L) 03/30/2016   MCV 91.4 03/30/2016   PLT 288.0 03/30/2016   UPT today: NEG  IMPRESSION AND PLAN:  Dysmenorrhea with increased vaginal bleeding over her baseline. UPT neg. Change ibup dosing to 600mg  tid prn.  Tramadol 50mg  1-2 q6h prn to use if ibup ineffective, #30, no RF. Check CBC today. Restart OCP now (don't wait until placebo pills run out). GC/Chlamydia swab obtained today.  If still with same problems at f/u, needs pelvic u/s to eval for fibroids.  An After Visit Summary was printed and given to the patient.  FOLLOW UP: Return for 1 week with me or PCP.  Signed:  Crissie Sickles, MD           06/27/2017

## 2017-06-27 NOTE — Telephone Encounter (Signed)
Patient states she forgot to ask MD for note for work for 10.15.18-10.16.18  Patient will print off of [facility].  Please advise.

## 2017-06-27 NOTE — Telephone Encounter (Signed)
Stefanie Hale, can you do this note so she can print it off from MyChart?-thx

## 2017-06-28 ENCOUNTER — Encounter: Payer: Self-pay | Admitting: *Deleted

## 2017-06-28 LAB — CERVICOVAGINAL ANCILLARY ONLY
CHLAMYDIA, DNA PROBE: NEGATIVE
NEISSERIA GONORRHEA: NEGATIVE

## 2017-07-02 ENCOUNTER — Other Ambulatory Visit: Payer: Self-pay | Admitting: Family Medicine

## 2017-07-02 DIAGNOSIS — N926 Irregular menstruation, unspecified: Secondary | ICD-10-CM

## 2017-07-03 ENCOUNTER — Telehealth: Payer: Self-pay | Admitting: *Deleted

## 2017-07-10 ENCOUNTER — Encounter: Payer: Self-pay | Admitting: Family Medicine

## 2017-07-10 DIAGNOSIS — N921 Excessive and frequent menstruation with irregular cycle: Secondary | ICD-10-CM

## 2017-07-10 NOTE — Addendum Note (Signed)
Addended by: Lamar Blinks C on: 07/10/2017 08:48 PM   Modules accepted: Orders

## 2017-07-11 NOTE — Progress Notes (Addendum)
[place] at [facility] [address], [place], [place] [address] [phone]  Date:  07/12/2017   Name:  Stefanie Hale   DOB:  [birth year]   MRN:  [id number]  PCP:  Darreld Mclean, MD    Chief Complaint: Dysmenorrhea (c/o long menstrual cycles. )   History of Present Illness:  Stefanie Hale is a 36 y.o. very pleasant female patient who presents with the following:  I last saw this patient myself [place] of this [place].  She went to see Dr. Vira Agar last week with concern of prolonged menstrual bleeding- he did an HCG, GC/ CMZ all which were negative.  He also did a pelvic exam CBC was ok at that visit   I also saw her for this issue in 7/17, had referred her to OBG and she saw Dr. Nehemiah Settle once  Pt reports that her current menses started on 10/12- which was a week earlier than she was expecting- and has not stopped since.   She is still having cramping and pain No recent OCP change She is still taking her OCP once a day through this bleeding episode When we discussed this last year we started OCP- starting the pills seemed to help a lot and her cycles were ok until this current issue She is on a 28 day cycle OCP She is not aware of any increased stress at this time She is changing her tampon/ pad 4x5x a day right now  She has no kids, 20 and 49 yo. She had bleeding with her youngest during pregnancy such that she did not realize she was pregnant until late in her term.  No history of miscarriages No personal history of blood clot or bleeding disorder No unusual bruising or other bleeding noted No family history of bleeding disorder or frequent MC  BP Readings from Last 3 Encounters:  07/12/17 112/72  06/27/17 119/80  12/27/16 118/76    Office Visit on 06/27/2017  Component Date Value Ref Range Status  . Preg Test, Ur 06/27/2017 Negative  Negative Final  . WBC 06/27/2017 4.8  4.0 - 10.5 K/uL Final  .  RBC 06/27/2017 4.16  3.87 - 5.11 Mil/uL Final  . Platelets 06/27/2017 314.0  150.0 - 400.0 K/uL Final  . Hemoglobin 06/27/2017 12.8  12.0 - 15.0 g/dL Final  . HCT 06/27/2017 39.6  36.0 - 46.0 % Final  . MCV 06/27/2017 95.1  78.0 - 100.0 fl Final  . MCHC 06/27/2017 32.5  30.0 - 36.0 g/dL Final  . RDW 06/27/2017 13.1  11.5 - 15.5 % Final  . Chlamydia 06/27/2017 Negative   Final   Normal Reference Range - Negative  . Neisseria gonorrhea 06/27/2017 Negative   Final   Normal Reference Range - Negative     Patient Active Problem List   Diagnosis Date Noted  . Medial tibial stress syndrome, right, initial encounter 10/25/2016  . Sinusitis 07/25/2013  . Irregular menses 07/01/2013  . Unspecified contraceptive management 07/01/2013  . Back pain 01/01/2012  . Rash 12/28/2011  . Vaginal discharge 12/28/2011  . Anemia 12/28/2011  . Cervical cancer screening 12/28/2011  . Annual physical exam 11/17/2011  . SOMNOLENCE 11/24/2010  . EPISTAXIS, RECURRENT 11/24/2010    Past Medical History:  Diagnosis Date  . Childhood asthma     Past Surgical History:  Procedure Laterality Date  . NO PAST SURGERIES     denies surgical history    Social  History  Substance Use Topics  . Smoking status: Never Smoker  . Smokeless tobacco: Never Used  . Alcohol use Yes    Family History  Problem Relation Age of Onset  . Arthritis Unknown   . Hypertension Unknown   . Stroke Unknown     Allergies  Allergen Reactions  . Influenza Vaccines Other (See Comments)    Arm swelling and felt very bad.  . Penicillins     REACTION: hives    Medication list has been reviewed and updated.  Current Outpatient Prescriptions on File Prior to Visit  Medication Sig Dispense Refill  . LESSINA-28 0.1-20 MG-MCG tablet TAKE 1 TABLET BY MOUTH DAILY 28 tablet 0  . traMADol (ULTRAM) 50 MG tablet 1-2 tabs po q6h prn pain 30 tablet 0   No current facility-administered medications on file prior to visit.      Review of Systems:  As per HPI- otherwise negative. No fever or chills She feels like she has to urinate more frequently than usual right now    Physical Examination: Vitals:   07/12/17 1245  BP: 112/72  Pulse: 81  SpO2: 97%   Vitals:   07/12/17 1245  Weight: 208 lb (94.3 kg)  Height: 5\' 7"  (1.702 m)   Body mass index is 32.58 kg/m. Ideal Body Weight: Weight in (lb) to have BMI = 25: 159.3  GEN: WDWN, NAD, Non-toxic, A & O x 3, obese, otherwise looks well HEENT: Atraumatic, Normocephalic. Neck supple. No masses, No LAD.  Bilateral TM wnl, oropharynx normal.  PEERL,EOMI.   Ears and Nose: No external deformity. CV: RRR, No M/G/R. No JVD. No thrill. No extra heart sounds. PULM: CTA B, no wheezes, crackles, rhonchi. No retractions. No resp. distress. No accessory muscle use. ABD: S, NT, ND, +BS. No rebound. No HSM.  Benign belly EXTR: No c/c/e NEURO Normal gait.  PSYCH: Normally interactive. Conversant. Not depressed or anxious appearing.  Calm demeanor.   Results for orders placed or performed in visit on 07/12/17  POCT urinalysis dipstick  Result Value Ref Range   Color, UA orange (A) yellow   Clarity, UA cloudy (A) clear   Glucose, UA negative negative mg/dL   Bilirubin, UA negative negative   Ketones, POC UA negative negative mg/dL   Spec Grav, UA >=1.030 (A) 1.010 - 1.025   Blood, UA large (A) negative   pH, UA 6.0 5.0 - 8.0   Protein Ur, POC negative negative mg/dL   Urobilinogen, UA 1.0 0.2 or 1.0 E.U./dL   Nitrite, UA Negative Negative   Leukocytes, UA Negative Negative    Assessment and Plan: Menorrhagia with irregular cycle - Plan: POCT urine pregnancy, CBC, TSH  Dysuria - Plan: Urine Culture, POCT urinalysis dipstick  Irregular menstrual cycle - Plan: levonorgestrel-ethinyl estradiol (LESSINA-28) 0.1-20 MG-MCG tablet  Here today with recurrent menometrorrhagia.  She has been bleeding briskly for 2 weeks. Will have her take a high dose OCP regimen  until bleeding resolves- she will use no longer than 5 days. Labs pending as above  We are going to use a higher dose birth control pill for a few days to stop your bleeding Take 3 pills a day (2 if 3 makes you feel too sick) for 4 or 5 days. This should get your bleeding to stop.  Then take 1 pill a day for another week.  Then stop pills for 5 days to allow for a withdrawal bleed, and go back on your normal pill regimen  I will  be in touch with your urine culture asap  See OBG next week as planned.  Please let me know if any concerns in the meantime  We made her an OBG appt for next week for follow-up Signed Lamar Blinks, MD  Received labs, message to pt

## 2017-07-12 ENCOUNTER — Encounter: Payer: Self-pay | Admitting: Family Medicine

## 2017-07-12 ENCOUNTER — Ambulatory Visit (INDEPENDENT_AMBULATORY_CARE_PROVIDER_SITE_OTHER): Payer: BLUE CROSS/BLUE SHIELD | Admitting: Family Medicine

## 2017-07-12 VITALS — BP 112/72 | HR 81 | Ht 67.0 in | Wt 208.0 lb

## 2017-07-12 DIAGNOSIS — N926 Irregular menstruation, unspecified: Secondary | ICD-10-CM | POA: Diagnosis not present

## 2017-07-12 DIAGNOSIS — R300 Dysuria: Secondary | ICD-10-CM | POA: Diagnosis not present

## 2017-07-12 DIAGNOSIS — N921 Excessive and frequent menstruation with irregular cycle: Secondary | ICD-10-CM

## 2017-07-12 LAB — POCT URINALYSIS DIP (MANUAL ENTRY)
Bilirubin, UA: NEGATIVE
GLUCOSE UA: NEGATIVE mg/dL
Ketones, POC UA: NEGATIVE mg/dL
Leukocytes, UA: NEGATIVE
NITRITE UA: NEGATIVE
Protein Ur, POC: NEGATIVE mg/dL
Spec Grav, UA: >=1.03 — AB (ref 1.010–1.025)
UROBILINOGEN UA: 1 U/dL
pH, UA: 6 (ref 5.0–8.0)

## 2017-07-12 LAB — CBC
HCT: 36.6 % (ref 36.0–46.0)
HEMOGLOBIN: 12 g/dL (ref 12.0–15.0)
MCHC: 32.7 g/dL (ref 30.0–36.0)
MCV: 94.6 fl (ref 78.0–100.0)
Platelets: 293 10*3/uL (ref 150.0–400.0)
RBC: 3.87 Mil/uL (ref 3.87–5.11)
RDW: 13 % (ref 11.5–15.5)
WBC: 4.5 10*3/uL (ref 4.0–10.5)

## 2017-07-12 LAB — POCT URINE PREGNANCY: PREG TEST UR: NEGATIVE

## 2017-07-12 LAB — TSH: TSH: 1.67 u[IU]/mL (ref 0.35–4.50)

## 2017-07-12 MED ORDER — LEVONORGESTREL-ETHINYL ESTRAD 0.1-20 MG-MCG PO TABS: ORAL_TABLET | ORAL | 0 refills | Status: DC

## 2017-07-12 NOTE — Patient Instructions (Addendum)
We are going to use a higher dose birth control pill for a few days to stop your bleeding Take 3 pills a day (2 if 3 makes you feel too sick) for 4 or 5 days. This should get your bleeding to stop.  Then take 1 pill a day for another week.  Then stop pills for 5 days to allow for a withdrawal bleed, and go back on your normal pill regimen  I will be in touch with your urine culture asap  See OBG next week as planned.  Please let me know if any concerns in the meantime

## 2017-07-13 ENCOUNTER — Encounter: Payer: Self-pay | Admitting: Family Medicine

## 2017-07-13 LAB — URINE CULTURE
MICRO NUMBER:: 81221701
Result:: NO GROWTH
SPECIMEN QUALITY:: ADEQUATE

## 2017-07-20 ENCOUNTER — Ambulatory Visit: Payer: BLUE CROSS/BLUE SHIELD | Admitting: Family Medicine

## 2017-07-22 ENCOUNTER — Other Ambulatory Visit: Payer: Self-pay | Admitting: Family Medicine

## 2017-07-27 ENCOUNTER — Ambulatory Visit (HOSPITAL_BASED_OUTPATIENT_CLINIC_OR_DEPARTMENT_OTHER)
Admission: RE | Admit: 2017-07-27 | Discharge: 2017-07-27 | Disposition: A | Discharge status: Home / Self Care | Payer: BLUE CROSS/BLUE SHIELD | Source: Ambulatory Visit | Attending: Family Medicine | Admitting: Family Medicine

## 2017-07-27 ENCOUNTER — Encounter: Payer: Self-pay | Admitting: Family Medicine

## 2017-07-27 ENCOUNTER — Ambulatory Visit (INDEPENDENT_AMBULATORY_CARE_PROVIDER_SITE_OTHER): Payer: BLUE CROSS/BLUE SHIELD | Admitting: Family Medicine

## 2017-07-27 VITALS — BP 132/61 | HR 87 | Ht 67.0 in | Wt 212.0 lb

## 2017-07-27 DIAGNOSIS — N939 Abnormal uterine and vaginal bleeding, unspecified: Secondary | ICD-10-CM

## 2017-07-27 DIAGNOSIS — D259 Leiomyoma of uterus, unspecified: Secondary | ICD-10-CM | POA: Insufficient documentation

## 2017-07-27 MED ORDER — NORGESTIMATE-ETH ESTRADIOL 0.25-35 MG-MCG PO TABS
1.0000 | ORAL_TABLET | Freq: Every day | ORAL | 11 refills | Status: DC
Start: 2017-07-27 — End: 2017-08-25

## 2017-07-27 NOTE — Progress Notes (Signed)
Patient has had a period since Jun 23 2017 Stefanie Hale RNBSN

## 2017-07-27 NOTE — Progress Notes (Signed)
   Subjective:    Patient ID: Stefanie Hale, female    DOB: 10-01-1980, 36 y.o.   MRN: [id number]  HPI Patient seen for abnormal uterine bleeding.  I last saw the patient a year and 3 months ago when she presented with 2 weeks of bleeding.  She has been on [place] for quite some time, but experienced some spotting when August.  She started having continued bleeding starting October 15 and has continued until now.  Primary care physician had her increase the Lessina to 3 tablets for 4-5 days, which she did.  Her bleeding slowed down but did not resolve.  She is continued to take 2 tablets a day.   Review of Systems     Objective:   Physical Exam  Constitutional: She is oriented to person, place, and time. She appears well-developed and well-nourished.  Genitourinary: There is no rash, tenderness, lesion or injury on the right labia. There is no rash, tenderness, lesion or injury on the left labia. Uterus is enlarged (8 week sized uterus). Uterus is not deviated and not fixed. Cervix exhibits no motion tenderness, no discharge and no friability. Right adnexum displays no mass, no tenderness and no fullness. Left adnexum displays no mass, no tenderness and no fullness. No erythema, tenderness or bleeding in the vagina. No foreign body in the vagina. No signs of injury around the vagina. No vaginal discharge found.    Neurological: She is alert and oriented to person, place, and time.  Skin: Skin is warm and dry.  Psychiatric: She has a normal mood and affect. Her behavior is normal. Judgment and thought content normal.      Assessment & Plan:  1. Abnormal uterine bleeding (AUB) We will do Sprintec taper: 3 tablets a day for 3 days, then 2 tablets a day for 3 days, then 1 tablet daily.  Will have patient skip placebo week, and filled new pack.  Discussed that high doses of estrogen may cause stomach upset.  We will also get ultrasound to evaluate the uterus and assure no fibroids or  endometrial thickening.  Polyp grasped with ring forceps and removed -only fragments obtained.  Sample not sent to pathology.  Patient instructed to follow-up if bleeding not improved -may need to put on Provera or Megace if OCP taper not controlling bleeding.  Will have patient follow-up in 6 weeks - US PELVIS (TRANSABDOMINAL ONLY); Future - US PELVIS TRANSVANGINAL NON-OB (TV ONLY); Future

## 2017-07-30 ENCOUNTER — Encounter: Payer: Self-pay | Admitting: Family Medicine

## 2017-07-31 NOTE — Telephone Encounter (Signed)
Please write a work excuse note until Friday.  Thanks!

## 2017-08-02 ENCOUNTER — Telehealth: Payer: Self-pay

## 2017-08-02 NOTE — Telephone Encounter (Signed)
Work note written. [name] RNBSN

## 2017-08-03 ENCOUNTER — Other Ambulatory Visit: Payer: Self-pay | Admitting: Family Medicine

## 2017-08-08 ENCOUNTER — Encounter: Payer: Self-pay | Admitting: Family Medicine

## 2017-08-10 ENCOUNTER — Telehealth: Payer: Self-pay | Admitting: *Deleted

## 2017-08-10 NOTE — Telephone Encounter (Signed)
Received FMLA/STD paperwork from [place]; completed as much as possible; forwarded to provider/SLS 11/29

## 2017-08-13 ENCOUNTER — Other Ambulatory Visit: Payer: Self-pay | Admitting: Family Medicine

## 2017-08-15 ENCOUNTER — Encounter: Payer: Self-pay | Admitting: Family Medicine

## 2017-08-16 ENCOUNTER — Other Ambulatory Visit: Payer: Self-pay | Admitting: Family Medicine

## 2017-08-17 ENCOUNTER — Encounter: Payer: Self-pay | Admitting: Family Medicine

## 2017-08-17 MED ORDER — TRAMADOL HCL 50 MG PO TABS: ORAL_TABLET | ORAL | 0 refills | Status: DC

## 2017-08-23 ENCOUNTER — Telehealth: Payer: Self-pay | Admitting: *Deleted

## 2017-08-23 NOTE — Telephone Encounter (Signed)
Copied from [place] #[id number]. Topic: Inquiry >> Aug 23, 2017  1:00 PM Boyd Kerbs wrote: Reason for CRM: Paperwork that was faxed in Nov. disability for work is needing to be in by the 15th of December.

## 2017-08-24 ENCOUNTER — Encounter: Payer: Self-pay | Admitting: Family Medicine

## 2017-08-25 MED ORDER — NORETHINDRONE ACETATE 5 MG PO TABS: 5.0000 mg | ORAL_TABLET | Freq: Four times a day (QID) | ORAL | 2 refills | Status: DC

## 2017-08-25 NOTE — Telephone Encounter (Signed)
Aris,  Let's try a different medication to get the bleeding under control. Stop taking the birth control pill. I've sent a prescription for Agestin (norethindrone) 5mg  to the pharmacy. Take it every 6 hours until the bleeding stops, then decrease it to every 8 hours for 3 days, then twice a day until you come and see me. Let me know if the bleeding doesn't stop by Tuesday.   Dr Nehemiah Settle

## 2017-08-25 NOTE — Addendum Note (Signed)
Addended by: Truett Mainland on: 08/25/2017 02:08 PM   Modules accepted: Orders

## 2017-08-28 NOTE — Telephone Encounter (Signed)
File Copy of paperwork with fax confirmation completed on 08/14/17 placed on provider's counter in clinic/SLS 12/17

## 2017-09-01 NOTE — Telephone Encounter (Signed)
Hi Stefanie Hale- I wondered if anything came through?  And just wanted to make sure the fax number she gave was correct- thank you

## 2017-09-06 ENCOUNTER — Encounter: Payer: Self-pay | Admitting: Family Medicine

## 2017-09-06 ENCOUNTER — Ambulatory Visit (INDEPENDENT_AMBULATORY_CARE_PROVIDER_SITE_OTHER): Payer: BLUE CROSS/BLUE SHIELD | Admitting: Family Medicine

## 2017-09-06 VITALS — BP 129/80 | HR 73 | Ht 67.0 in | Wt 213.0 lb

## 2017-09-06 DIAGNOSIS — N939 Abnormal uterine and vaginal bleeding, unspecified: Secondary | ICD-10-CM | POA: Diagnosis not present

## 2017-09-06 NOTE — Progress Notes (Signed)
Patient reports bleeding stopped one week ago. [name] RNBSN

## 2017-09-06 NOTE — Progress Notes (Signed)
   Subjective:    Patient ID: Stefanie Hale, female    DOB: 16-Feb-1981, 36 y.o.   MRN: [id number]  HPI  Patient seen for follow-up of abnormal uterine bleeding she had been on Sprintec taper, which initially improved her bleeding.  However when she went to 1 tablet a day, her bleeding increased again.  She was switched to Aygestin approximately 10 days ago.  Last Tuesday or Wednesday, her bleeding stopped and she is continue to take Aygestin 5 mg 3 times a day.  For the last 5 days or so.  She continues to not have any bleeding.  Ultrasound 07/27/17 shows 4 fibroids, 2 of which are submucosal.  These measure 2-3 cm in diameter.  Review of Systems     Objective:   Physical Exam  Constitutional: She appears well-developed and well-nourished.  Cardiovascular: Normal rate, regular rhythm and normal heart sounds.  Pulmonary/Chest: Effort normal and breath sounds normal.  Abdominal: Soft. Bowel sounds are normal.  Skin: Skin is warm and dry.  Psychiatric: She has a normal mood and affect. Her behavior is normal. Judgment and thought content normal.      Assessment & Plan:  1. Abnormal uterine bleeding (AUB) Decrease Aygestin to 1 tablet twice a day for the next 5 days, then change to 1 tablet daily.  Will have patient follow-up in 3 months.  Will have patient meet with GYN surgeon to see whether fibroidectomy would be warranted.  Patient to call sooner with any changes.

## 2017-09-06 NOTE — Addendum Note (Signed)
Addended by: Truett Mainland on: 09/06/2017 04:00 PM   Modules accepted: Orders

## 2017-09-15 ENCOUNTER — Encounter: Payer: Self-pay | Admitting: Family Medicine

## 2017-09-18 ENCOUNTER — Telehealth: Payer: Self-pay | Admitting: *Deleted

## 2017-09-18 NOTE — Telephone Encounter (Signed)
Received FMLA/STD paperwork from [facility] [employer]; new claim number from Nov/Dec claim, will call patient for details to  completed as much as possible and forwarded to provider/SLS 01/07

## 2017-09-19 ENCOUNTER — Other Ambulatory Visit: Payer: Self-pay | Admitting: Family Medicine

## 2017-09-19 MED ORDER — TRAMADOL HCL 50 MG PO TABS: ORAL_TABLET | ORAL | 0 refills | Status: DC

## 2017-09-19 NOTE — Telephone Encounter (Signed)
LMOM with contact name and number for return call RE: need for new Disability and Leave paperwork received per provider instructions/SLS 01/08

## 2017-09-20 ENCOUNTER — Other Ambulatory Visit: Payer: Self-pay | Admitting: Emergency Medicine

## 2017-09-21 ENCOUNTER — Ambulatory Visit (INDEPENDENT_AMBULATORY_CARE_PROVIDER_SITE_OTHER): Payer: BLUE CROSS/BLUE SHIELD | Admitting: Family Medicine

## 2017-09-21 ENCOUNTER — Encounter: Payer: Self-pay | Admitting: Family Medicine

## 2017-09-21 VITALS — BP 108/82 | HR 82 | Temp 98.7°F | Ht 67.0 in | Wt 210.2 lb

## 2017-09-21 DIAGNOSIS — J069 Acute upper respiratory infection, unspecified: Secondary | ICD-10-CM

## 2017-09-21 DIAGNOSIS — B9789 Other viral agents as the cause of diseases classified elsewhere: Secondary | ICD-10-CM

## 2017-09-21 MED ORDER — HYDROCODONE-HOMATROPINE 5-1.5 MG/5ML PO SYRP: 5.0000 mL | ORAL_SOLUTION | Freq: Three times a day (TID) | ORAL | 0 refills | Status: DC | PRN

## 2017-09-21 MED ORDER — METHYLPREDNISOLONE ACETATE 80 MG/ML IJ SUSP
80.0000 mg | Freq: Once | INTRAMUSCULAR | Status: AC
  Administered 2017-09-21: 80 mg via INTRAMUSCULAR

## 2017-09-21 NOTE — Patient Instructions (Signed)
Continue to push fluids, practice good hand hygiene, and cover your mouth if you cough.  If you start having fevers, shaking or shortness of breath, seek immediate care.  Ibuprofen 400-600 mg (2-3 over the counter strength tabs) every 6 hours as needed for pain.  OK to take Tylenol 1000 mg (2 extra strength tabs) or 975 mg (3 regular strength tabs) every 6 hours as needed.  Let us know if you need anything.  

## 2017-09-21 NOTE — Progress Notes (Signed)
Chief Complaint  Patient presents with  . Hoarse  . Cough    Stefanie Hale here for URI complaints.  Duration: 5 days  Associated symptoms: hoarseness, sinus congestion, rhinorrhea, sore throat, wheezing, chest tightness and dry cough Denies: sinus pain, itchy watery eyes, ear pain, ear drainage, shortness of breath, myalgia and fevers/rigors Treatment to date: Delsum, theraflu Sick contacts: Yes- coworkers  ROS:  Const: Denies fevers HEENT: As noted in HPI Lungs: No SOB  Past Medical History:  Diagnosis Date  . Childhood asthma    BP 108/82 (BP Location: Left Arm, Patient Position: Sitting, Cuff Size: Large)   Pulse 82   Temp 98.7 F (37.1 C) (Oral)   Ht 5\' 7"  (1.702 m)   Wt 210 lb 4 oz (95.4 kg)   PF 97 L/min   BMI 32.93 kg/m  General: Awake, alert, appears stated age HEENT: AT, [place], ears patent b/l and TM's neg, nares patent w/o discharge, pharynx pink and without exudates, MMM Neck: No masses or asymmetry, +ttp over cerv ln's Heart: RRR Lungs: CTAB, no accessory muscle use Psych: Age appropriate judgment and insight, normal mood and affect  Viral URI with cough  Orders as above. Strep test neg.  Letter for work given. Depo injection for symptom control and for reports of wheezing, though I did not hear any today. Continue to push fluids, practice good hand hygiene, cover mouth when coughing. F/u prn. If starting to experience fevers, shaking, or shortness of breath, seek immediate care. Pt voiced understanding and agreement to the plan.  [place], DO 09/21/17 2:44 PM

## 2017-09-21 NOTE — Addendum Note (Signed)
Addended by: Sharon Seller B on: 09/21/2017 03:31 PM   Modules accepted: Orders

## 2017-09-21 NOTE — Progress Notes (Signed)
Pre visit review using our clinic review tool, if applicable. No additional management support is needed unless otherwise documented below in the visit note. 

## 2017-09-22 ENCOUNTER — Encounter: Payer: Self-pay | Admitting: Family Medicine

## 2017-09-22 NOTE — Telephone Encounter (Signed)
Copied from [place] [phone] [phone]. Topic: Quick Communication - See Telephone Encounter >> Sep 22, 2017  4:22 PM [place], Laban Emperor wrote: CRM for notification. See Telephone encounter for:  Pt would like Ivin Booty to give her a call back, states she has some questions about her FMLA paperwork.  09/22/17.

## 2017-09-27 NOTE — Telephone Encounter (Signed)
Can you get work this patient in with Dr Ihor Dow? She will need an endometrial biopsy

## 2017-09-28 ENCOUNTER — Other Ambulatory Visit (HOSPITAL_COMMUNITY)
Admission: RE | Admit: 2017-09-28 | Discharge: 2017-09-28 | Disposition: A | Discharge status: Home / Self Care | Payer: BLUE CROSS/BLUE SHIELD | Source: Ambulatory Visit | Attending: Obstetrics & Gynecology | Admitting: Obstetrics & Gynecology

## 2017-09-28 ENCOUNTER — Ambulatory Visit (INDEPENDENT_AMBULATORY_CARE_PROVIDER_SITE_OTHER): Payer: BLUE CROSS/BLUE SHIELD | Admitting: Obstetrics & Gynecology

## 2017-09-28 ENCOUNTER — Encounter (HOSPITAL_COMMUNITY): Payer: Self-pay

## 2017-09-28 ENCOUNTER — Encounter: Payer: Self-pay | Admitting: Obstetrics & Gynecology

## 2017-09-28 VITALS — BP 135/81 | HR 76 | Wt 206.0 lb

## 2017-09-28 DIAGNOSIS — Z01812 Encounter for preprocedural laboratory examination: Secondary | ICD-10-CM

## 2017-09-28 DIAGNOSIS — Z3202 Encounter for pregnancy test, result negative: Secondary | ICD-10-CM

## 2017-09-28 DIAGNOSIS — D219 Benign neoplasm of connective and other soft tissue, unspecified: Secondary | ICD-10-CM | POA: Diagnosis not present

## 2017-09-28 DIAGNOSIS — N939 Abnormal uterine and vaginal bleeding, unspecified: Secondary | ICD-10-CM

## 2017-09-28 LAB — POCT URINE PREGNANCY: Preg Test, Ur: NEGATIVE

## 2017-09-28 MED ORDER — MEGESTROL ACETATE 40 MG PO TABS: 80.0000 mg | ORAL_TABLET | Freq: Two times a day (BID) | ORAL | 2 refills | Status: DC

## 2017-09-28 NOTE — Patient Instructions (Addendum)
Hysterectomy Information A hysterectomy is a surgery to remove your uterus. After surgery, you will no longer have periods. Also, you will not be able to get pregnant. Reasons for this surgery  You have bleeding that is not normal and keeps coming back.  You have lasting (chronic) lower belly (pelvic) pain.  You have a lasting infection.  The lining of your uterus grows outside your uterus.  The lining of your uterus grows in the muscle of your uterus.  Your uterus falls down into your vagina.  You have a growth in your uterus that causes problems.  You have cells that could turn into cancer (precancerous cells).  You have cancer of the uterus or cervix. Types There are 3 types of hysterectomies. Depending on the type, the surgery will:  Remove the top part of the uterus only.  Remove the uterus and the cervix.  Remove the uterus, cervix, and tissue that holds the uterus in place in the lower belly.  Ways a hysterectomy can be performed There are 5 ways this surgery can be performed.  A cut (incision) is made in the belly (abdomen). The uterus is taken out through the cut.  A cut is made in the vagina. The uterus is taken out through the cut.  Three or four cuts are made in the belly. A surgical device with a camera is put through one of the cuts. The uterus is cut into small pieces. The uterus is taken out through the cuts or the vagina.  Three or four cuts are made in the belly. A surgical device with a camera is put through one of the cuts. The uterus is taken out through the vagina.  Three or four cuts are made in the belly. A surgical device that is controlled by a computer makes a visual image. The device helps the surgeon control the surgical tools. The uterus is cut into small pieces. The pieces are taken out through the cuts or through the vagina.  What can I expect after the surgery?  You will be given pain medicine.  You will need help at home for 3-5 days  after surgery.  You will need to see your doctor in 2-4 weeks after surgery.  You may get hot flashes, have night sweats, and have trouble sleeping.  You may need to have Pap tests in the future if your surgery was related to cancer. Talk to your doctor. It is still good to have regular exams. This information is not intended to replace advice given to you by your health care provider. Make sure you discuss any questions you have with your health care provider. Document Released: 11/21/2011 Document Revised: 02/04/2016 Document Reviewed: 05/06/2013 Elsevier Interactive Patient Education  2018 [place]. Vaginal Hysterectomy, Care After Refer to this sheet in the next few weeks. These instructions provide you with information about caring for yourself after your procedure. Your health care provider may also give you more specific instructions. Your treatment has been planned according to current medical practices, but problems sometimes occur. Call your health care provider if you have any problems or questions after your procedure. What can I expect after the procedure? After the procedure, it is common to have:  Pain.  Soreness and numbness in your incision areas.  Vaginal bleeding and discharge.  Constipation.  Temporary problems emptying the bladder.  Feelings of sadness or other emotions.  Follow these instructions at home: Medicines  Take over-the-counter and prescription medicines only as told by your  health care provider.  If you were prescribed an antibiotic medicine, take it as told by your health care provider. Do not stop taking the antibiotic even if you start to feel better.  Do not drive or operate heavy machinery while taking prescription pain medicine. Activity  Return to your normal activities as told by your health care provider. Ask your health care provider what activities are safe for you.  Get regular exercise as told by your health care provider. You  may be told to take short walks every day and go farther each time.  Do not lift anything that is heavier than 10 lb (4.5 kg). General instructions   Do not put anything in your vagina for 6 weeks after your surgery or as told by your health care provider. This includes tampons and douches.  Do not have sex until your health care provider says you can.  Do not take baths, swim, or use a hot tub until your health care provider approves.  Drink enough fluid to keep your urine clear or pale yellow.  Do not drive for 24 hours if you were given a sedative.  Keep all follow-up visits as told by your health care provider. This is important. Contact a health care provider if:  Your pain medicine is not helping.  You have a fever.  You have redness, swelling, or pain at your incision site.  You have blood, pus, or a bad-smelling discharge from your vagina.  You continue to have difficulty urinating. Get help right away if:  You have severe abdominal or back pain.  You have heavy bleeding from your vagina.  You have chest pain or shortness of breath. This information is not intended to replace advice given to you by your health care provider. Make sure you discuss any questions you have with your health care provider. Document Released: 12/21/2015 Document Revised: 02/04/2016 Document Reviewed: 09/13/2015 Elsevier Interactive Patient Education  2018 [place].  Endometrial Ablation Endometrial ablation is a procedure that destroys the thin inner layer of the lining of the uterus (endometrium). This procedure may be done:  To stop heavy periods.  To stop bleeding that is causing anemia.  To control irregular bleeding.  To treat bleeding caused by small tumors (fibroids) in the endometrium.  This procedure is often an alternative to major surgery, such as removal of the uterus and cervix (hysterectomy). As a result of this procedure:  You may not be able to have children.  However, if you are premenopausal (you have not gone through menopause): ? You may still have a small chance of getting pregnant. ? You will need to use a reliable method of birth control after the procedure to prevent pregnancy.  You may stop having a menstrual period, or you may have only a small amount of bleeding during your period. Menstruation may return several years after the procedure.  Tell a health care provider about:  Any allergies you have.  All medicines you are taking, including vitamins, herbs, eye drops, creams, and over-the-counter medicines.  Any problems you or family members have had with the use of anesthetic medicines.  Any blood disorders you have.  Any surgeries you have had.  Any medical conditions you have. What are the risks? Generally, this is a safe procedure. However, problems may occur, including:  A hole (perforation) in the uterus or bowel.  Infection of the uterus, bladder, or vagina.  Bleeding.  Damage to other structures or organs.  An air bubble  in the lung (air embolus).  Problems with pregnancy after the procedure.  Failure of the procedure.  Decreased ability to diagnose cancer in the endometrium.  What happens before the procedure?  You will have tests of your endometrium to make sure there are no pre-cancerous cells or cancer cells present.  You may have an ultrasound of the uterus.  You may be given medicines to thin the endometrium.  Ask your health care provider about: ? Changing or stopping your regular medicines. This is especially important if you take diabetes medicines or blood thinners. ? Taking medicines such as aspirin and ibuprofen. These medicines can thin your blood. Do not take these medicines before your procedure if your doctor tells you not to.  Plan to have someone take you home from the hospital or clinic. What happens during the procedure?  You will lie on an exam table with your feet and legs  supported as in a pelvic exam.  To lower your risk of infection: ? Your health care team will wash or sanitize their hands and put on germ-free (sterile) gloves. ? Your genital area will be washed with soap.  An IV tube will be inserted into one of your veins.  You will be given a medicine to help you relax (sedative).  A surgical instrument with a light and camera (resectoscope) will be inserted into your vagina and moved into your uterus. This allows your surgeon to see inside your uterus.  Endometrial tissue will be removed using one of the following methods: ? Radiofrequency. This method uses a radiofrequency-alternating electric current to remove the endometrium. ? Cryotherapy. This method uses extreme cold to freeze the endometrium. ? Heated-free liquid. This method uses a heated saltwater (saline) solution to remove the endometrium. ? Microwave. This method uses high-energy microwaves to heat up the endometrium and remove it. ? Thermal balloon. This method involves inserting a catheter with a balloon tip into the uterus. The balloon tip is filled with heated fluid to remove the endometrium. The procedure may vary among health care providers and hospitals. What happens after the procedure?  Your blood pressure, heart rate, breathing rate, and blood oxygen level will be monitored until the medicines you were given have worn off.  As tissue healing occurs, you may notice vaginal bleeding for 4-6 weeks after the procedure. You may also experience: ? Cramps. ? Thin, watery vaginal discharge that is light pink or brown in color. ? A need to urinate more frequently than usual. ? Nausea.  Do not drive for 24 hours if you were given a sedative.  Do not have sex or insert anything into your vagina until your health care provider approves. Summary  Endometrial ablation is done to treat the many causes of heavy menstrual bleeding.  The procedure may be done only after medications have  been tried to control the bleeding.  Plan to have someone take you home from the hospital or clinic. This information is not intended to replace advice given to you by your health care provider. Make sure you discuss any questions you have with your health care provider. Document Released: 07/08/2004 Document Revised: 09/15/2016 Document Reviewed: 09/15/2016 Elsevier Interactive Patient Education  2017 [employer].

## 2017-09-28 NOTE — Progress Notes (Signed)
surg path History:  37 y.o. [id number] here today for AUB.s/p SVD x 2.  Pt reports monthly cycles that are heavy. In OCT the menses started and didn't stop. Her normal cycle length is 7 days with spotting in between which is light. The cycles are assoc with clots.  Pt was prev on OCPs and Agystin with no relief of sx. The bleeding stopped for ~ 1 1/2 weeks and then restrained. Pt bleeds daily. The bleeding is heavy dn causes ruin of clothes and bed sheet. Pt changes her pads often pt wen through 3 packs of overnight pads in 1 months. She wears the overnight pads. Pt was on OCPs for contraception ([place]) prior to the Aygestin.    The following portions of the patient's history were reviewed and updated as appropriate: allergies, current medications, past family history, past medical history, past social history, past surgical history and problem list.  Review of Systems:  Pertinent items are noted in HPI.   Objective:  Physical Exam Blood pressure 135/81, pulse 76, weight 206 lb (93.4 kg), last menstrual period 09/12/2017.  CONSTITUTIONAL: Well-developed, well-nourished female in no acute distress.  HENT:  Normocephalic, atraumatic EYES: Conjunctivae and EOM are normal. No scleral icterus.  NECK: Normal range of motion SKIN: Skin is warm and dry. No rash noted. Not diaphoretic.No pallor. [place]: Alert and oriented to person, place, and time. Normal coordination.   The indications for endometrial biopsy were reviewed.   Risks of the biopsy including cramping, bleeding, infection, uterine perforation, inadequate specimen and need for additional procedures  were discussed. The patient states she understands and agrees to undergo procedure today. Consent was signed. Time out was performed. Urine HCG was negative. A sterile speculum was placed in the patient's vagina and the cervix was prepped with Betadine. A single-toothed tenaculum was placed on the anterior lip of the cervix to stabilize it.  The 3 mm pipelle was introduced into the endometrial cavity without difficulty to a depth of 12cm, and a moderate amount of tissue was obtained and sent to pathology. The instruments were removed from the patient's vagina. Minimal bleeding from the cervix was noted. The patient tolerated the procedure well. Routine post-procedure instructions were given to the patient. The patient will follow up to review the results and for further management.    Labs and Imaging 07/27/2017 CLINICAL DATA:  Initial evaluation for abnormal uterine bleeding for greater than 1 year.  EXAM: TRANSABDOMINAL AND TRANSVAGINAL ULTRASOUND OF PELVIS  TECHNIQUE: Both transabdominal and transvaginal ultrasound examinations of the pelvis were performed. Transabdominal technique was performed for global imaging of the pelvis including uterus, ovaries, adnexal regions, and pelvic cul-de-sac. It was necessary to proceed with endovaginal exam following the transabdominal exam to visualize the uterus and ovaries.  COMPARISON:  None available.  FINDINGS: Uterus  Measurements: 11.2 x 6.4 x 7.7 cm. Multiple uterine fibroids present, 4 largest of which are measured. 2.3 x 2.3 x 2.3 cm intramural fibroid present at the left uterine fundus. 2.6 x 2.6 x 2.6 cm intramural/sub mucosal fibroid present at the right uterine fundus. 2.6 x 2.1 x 2.9 cm submucosal fibroid present at the mid uterine body. 2.1 x 2.5 x 2.2 cm intramural fibroid present at the right posterior uterine body.  Endometrium  Thickness: 5.1 mm. No definite focal abnormality, although evaluation somewhat limited by a in the submucosal fibroid. Small amount of fluid noted within the endocervical canal.  Right ovary  Measurements: 3.0 x 1.7 x 2.7 cm. Normal appearance/no adnexal mass.  Left ovary  Measurements: 3.2 x 1.8 x 2.1 cm. Normal appearance/no adnexal mass.  Other findings  Trace free physiologic fluid within the  pelvis.  IMPRESSION: 1. Fibroid uterus as detailed above. 2. Endometrial stripe measures 5.1 mm without definite abnormality, although please note evaluation somewhat limited by a shadowing from adjacent uterine fibroids. If bleeding remains unresponsive to hormonal or medical therapy, sonohysterogram could be considered for focal lesion work-up. (Ref: Radiological Reasoning: Algorithmic Workup of Abnormal Vaginal Bleeding with Endovaginal Sonography and Sonohysterography. AJR 2008; 203:T59-74). 3. Otherwise normal pelvic ultrasound.   Assessment & Plan:  AUB thought due to uterine fibroids. Pt does not desire future childbearing potential she is tired of taking meds. She is interested in a TVH. I have told pt that we can schedule her but, I'd like her to read over this info on hyst vs ablation vs hysteroscopy with possible myomectomy. She will f/u in 2 week for further discussion and review of her surg path.   d/c Aygestin Begin Megace 80mg  bid x 2 weeks then 40mg  bid. F/u surg path for Endo bx and endocervical polyp Scheduled TVH with bilateral salpingectomy- tp to f/u for further discussion  Celica Kotowski L. Harraway-Smith, M.D., Cherlynn June

## 2017-10-02 ENCOUNTER — Encounter: Payer: Self-pay | Admitting: Family Medicine

## 2017-10-02 DIAGNOSIS — J40 Bronchitis, not specified as acute or chronic: Secondary | ICD-10-CM

## 2017-10-02 MED ORDER — AZITHROMYCIN 250 MG PO TABS: ORAL_TABLET | ORAL | 0 refills | Status: DC

## 2017-10-02 NOTE — Telephone Encounter (Signed)
Patient was seen by Dr. Nani Ravens for 09/21/2017 for vital URI with cough. Patient was told to return if symptoms worsen or fail to improve. Please advise.

## 2017-10-02 NOTE — Addendum Note (Signed)
Addended by: Lamar Blinks C on: 10/02/2017 09:43 PM   Modules accepted: Orders

## 2017-10-18 ENCOUNTER — Other Ambulatory Visit: Payer: Self-pay | Admitting: Family Medicine

## 2017-10-26 NOTE — Telephone Encounter (Signed)
Me    11:21 AM  Note    LMOM with contact name and number for return call RE: need for new Disability and Leave paperwork received per provider instructions/SLS 01/08        11:20 AM    You attempted to contact [facility], Shaquasha L (Left Message)  September 18, 2017   8:24 AM  You routed this conversation to Me  Me   8:22 AM  Note    Received FMLA/STD paperwork from [employer]; new claim number from Nov/Dec claim, will call patient for details to  completed as much as possible and forwarded to provider/SLS 01/07

## 2017-10-30 ENCOUNTER — Ambulatory Visit (INDEPENDENT_AMBULATORY_CARE_PROVIDER_SITE_OTHER): Payer: BLUE CROSS/BLUE SHIELD | Admitting: Obstetrics & Gynecology

## 2017-10-30 ENCOUNTER — Encounter: Payer: Self-pay | Admitting: Obstetrics & Gynecology

## 2017-10-30 VITALS — BP 124/79 | HR 76 | Wt 216.0 lb

## 2017-10-30 DIAGNOSIS — N939 Abnormal uterine and vaginal bleeding, unspecified: Secondary | ICD-10-CM

## 2017-10-30 NOTE — Progress Notes (Signed)
History:  37 y.o. [id number] here today for AUB. Pt reports that she stopped bleeding Jan 23rd. She now has intermittent light bleeding.   Stilll on Megace twice a day. Pt with undesried fertility. Pt is scheduled for a TVH. She wants to proceed and is also here for pre op counseling. She is worried about her mother who is ill and may need to reschedule the case but. Needs to wait until her mothers appt later this week.      The following portions of the patient's history were reviewed and updated as appropriate: allergies, current medications, past family history, past medical history, past social history, past surgical history and problem list.  Review of Systems:  Pertinent items are noted in HPI.   Objective:  Physical Exam Blood pressure 124/79, pulse 76, weight 216 lb (98 kg).  CONSTITUTIONAL: Well-developed, well-nourished female in no acute distress.  HENT:  Normocephalic, atraumatic EYES: Conjunctivae and EOM are normal. No scleral icterus.  NECK: Normal range of motion SKIN: Skin is warm and dry. No rash noted. Not diaphoretic.No pallor. [place]: Alert and oriented to person, place, and time. Normal coordination.   Labs and Imaging 09/28/2017 Diagnosis 1. Endometrium, biopsy - BENIGN POLYPOID ENDOMETRIUM WITH DECIDUALIZED STROMA. SEE COMMENT. - NO HYPERPLASIA OR MALIGNANCY IDENTIFIED. 2. Cervix, polyp - BENIGN ENDOCERVICAL POLYP. - NO ATYPIA, DYSPLASIA, OR MALIGNANCY IDENTIFIED.  Assessment & Plan:  AUB- sx improved but, not resolved with the Megace. Pt is still on fairly high doses of the Megace   decrease Megace to 40mg  bid   Patient desires surgical management with [facility] with bilateral salpingectomy.  The risks of surgery were discussed in detail with the patient including but not limited to: bleeding which may require transfusion or reoperation; infection which may require prolonged hospitalization or re-hospitalization and antibiotic therapy; injury to bowel, bladder,  ureters and major vessels or other surrounding organs; need for additional procedures including laparotomy; thromboembolic phenomenon, incisional problems and other postoperative or anesthesia complications.  Patient was told that the likelihood that her condition and symptoms will be treated effectively with this surgical management was very high; the postoperative expectations were also discussed in detail. The patient also understands the alternative treatment options which were discussed in full. All questions were answered.  She was told that she if she needs to change the date of the surgery to please call us ASAP.  She was told she may be called for a preoperative appointment about a week prior to surgery and will be given further preoperative instructions at that visit. Printed patient education handouts about the procedure were given to the patient to review at home.  Total face-to-face time with patient was 20 min.  Greater than 50% was spent in counseling and coordination of care with the patient.     Lena Fieldhouse L. Harraway-Smith, M.D., Cherlynn June

## 2017-10-30 NOTE — Patient Instructions (Signed)
Vaginal Hysterectomy A vaginal hysterectomy is a procedure to remove all or part of the uterus through a small incision in the vagina. In this procedure, your health care provider may remove your entire uterus, including the lower end (cervix). You may need a vaginal hysterectomy to treat:  Uterine fibroids.  A condition that causes the lining of the uterus to grow in other areas (endometriosis).  Problems with pelvic support.  Cancer of the cervix, ovaries, uterus, or tissue that lines the uterus (endometrium).  Excessive (dysfunctional) uterine bleeding.  When removing your uterus, your health care provider may also remove the organs that produce eggs (ovaries) and the tubes that carry eggs to your uterus (fallopian tubes). After a vaginal hysterectomy, you will no longer be able to have a baby. You will also no longer get your menstrual period. Tell a health care provider about:  Any allergies you have.  All medicines you are taking, including vitamins, herbs, eye drops, creams, and over-the-counter medicines.  Any problems you or family members have had with anesthetic medicines.  Any blood disorders you have.  Any surgeries you have had.  Any medical conditions you have.  Whether you are pregnant or may be pregnant. What are the risks? Generally, this is a safe procedure. However, problems may occur, including:  Bleeding.  Infection.  A blood clot that forms in your leg and travels to your lungs (pulmonary embolism).  Damage to surrounding organs.  Pain during sex.  What happens before the procedure?  Ask your health care provider what organs will be removed during surgery.  Ask your health care provider about: ? Changing or stopping your regular medicines. This is especially important if you are taking diabetes medicines or blood thinners. ? Taking medicines such as aspirin and ibuprofen. These medicines can thin your blood. Do not take these medicines before  your procedure if your health care provider instructs you not to.  Follow instructions from your health care provider about eating or drinking restrictions.  Do not use any tobacco products, such as cigarettes, chewing tobacco, and e-cigarettes. If you need help quitting, ask your health care provider.  Plan to have someone take you home after discharge from the hospital. What happens during the procedure?  To reduce your risk of infection: ? Your health care team will wash or sanitize their hands. ? Your skin will be washed with soap.  An IV tube will be inserted into one of your veins.  You may be given antibiotic medicine to help prevent infection.  You will be given one or more of the following: ? A medicine to help you relax (sedative). ? A medicine to numb the area (local anesthetic). ? A medicine to make you fall asleep (general anesthetic). ? A medicine that is injected into an area of your body to numb everything beyond the injection site (regional anesthetic).  Your surgeon will make an incision in your vagina.  Your surgeon will locate and remove all or part of your uterus.  Your ovaries and fallopian tubes may be removed at the same time.  The incision will be closed with stitches (sutures) that dissolve over time. The procedure may vary among health care providers and hospitals. What happens after the procedure?  Your blood pressure, heart rate, breathing rate, and blood oxygen level will be monitored often until the medicines you were given have worn off.  You will be encouraged to get up and walk around after a few hours to help prevent   complications.  You may have IV tubes in place for a few days.  You will be given pain medicine as needed.  Do not drive for 24 hours if you were given a sedative. This information is not intended to replace advice given to you by your health care provider. Make sure you discuss any questions you have with your health care  provider. Document Released: 12/21/2015 Document Revised: 02/04/2016 Document Reviewed: 09/13/2015 Elsevier Interactive Patient Education  2018 [employer]  Vaginal Hysterectomy, Care After Refer to this sheet in the next few weeks. These instructions provide you with information about caring for yourself after your procedure. Your health care provider may also give you more specific instructions. Your treatment has been planned according to current medical practices, but problems sometimes occur. Call your health care provider if you have any problems or questions after your procedure. What can I expect after the procedure? After the procedure, it is common to have:  Pain.  Soreness and numbness in your incision areas.  Vaginal bleeding and discharge.  Constipation.  Temporary problems emptying the bladder.  Feelings of sadness or other emotions.  Follow these instructions at home: Medicines  Take over-the-counter and prescription medicines only as told by your health care provider.  If you were prescribed an antibiotic medicine, take it as told by your health care provider. Do not stop taking the antibiotic even if you start to feel better.  Do not drive or operate heavy machinery while taking prescription pain medicine. Activity  Return to your normal activities as told by your health care provider. Ask your health care provider what activities are safe for you.  Get regular exercise as told by your health care provider. You may be told to take short walks every day and go farther each time.  Do not lift anything that is heavier than 10 lb (4.5 kg). General instructions   Do not put anything in your vagina for 6 weeks after your surgery or as told by your health care provider. This includes tampons and douches.  Do not have sex until your health care provider says you can.  Do not take baths, swim, or use a hot tub until your health care provider approves.  Drink  enough fluid to keep your urine clear or pale yellow.  Do not drive for 24 hours if you were given a sedative.  Keep all follow-up visits as told by your health care provider. This is important. Contact a health care provider if:  Your pain medicine is not helping.  You have a fever.  You have redness, swelling, or pain at your incision site.  You have blood, pus, or a bad-smelling discharge from your vagina.  You continue to have difficulty urinating. Get help right away if:  You have severe abdominal or back pain.  You have heavy bleeding from your vagina.  You have chest pain or shortness of breath. This information is not intended to replace advice given to you by your health care provider. Make sure you discuss any questions you have with your health care provider. Document Released: 12/21/2015 Document Revised: 02/04/2016 Document Reviewed: 09/13/2015 Elsevier Interactive Patient Education  2018 [employer] Inc.  

## 2017-11-04 ENCOUNTER — Encounter: Payer: Self-pay | Admitting: Obstetrics & Gynecology

## 2017-11-14 ENCOUNTER — Other Ambulatory Visit: Payer: Self-pay | Admitting: Family Medicine

## 2017-11-15 ENCOUNTER — Encounter (HOSPITAL_COMMUNITY): Payer: Self-pay

## 2017-11-15 ENCOUNTER — Other Ambulatory Visit: Payer: Self-pay | Admitting: Family Medicine

## 2017-11-21 ENCOUNTER — Encounter: Payer: Self-pay | Admitting: Obstetrics & Gynecology

## 2017-11-22 ENCOUNTER — Other Ambulatory Visit: Payer: Self-pay | Admitting: Obstetrics & Gynecology

## 2017-11-22 MED ORDER — TRANEXAMIC ACID 650 MG PO TABS: 1300.0000 mg | ORAL_TABLET | Freq: Three times a day (TID) | ORAL | 1 refills | Status: DC

## 2017-11-22 MED ORDER — MEGESTROL ACETATE 40 MG PO TABS: 40.0000 mg | ORAL_TABLET | Freq: Two times a day (BID) | ORAL | 1 refills | Status: DC

## 2017-12-01 ENCOUNTER — Encounter: Payer: Self-pay | Admitting: Obstetrics & Gynecology

## 2017-12-27 ENCOUNTER — Encounter: Payer: Self-pay | Admitting: Obstetrics & Gynecology

## 2017-12-28 ENCOUNTER — Telehealth: Payer: Self-pay | Admitting: Obstetrics & Gynecology

## 2017-12-28 NOTE — Telephone Encounter (Signed)
TC to pt. Left message regarding date of surgery and disability papers.   Rodolph Hagemann L. Harraway-Smith, M.D., [facility]

## 2017-12-29 ENCOUNTER — Other Ambulatory Visit: Payer: Self-pay | Admitting: Family Medicine

## 2018-01-01 ENCOUNTER — Encounter (INDEPENDENT_AMBULATORY_CARE_PROVIDER_SITE_OTHER): Payer: Self-pay

## 2018-01-01 ENCOUNTER — Telehealth (HOSPITAL_COMMUNITY): Payer: Self-pay

## 2018-01-01 ENCOUNTER — Encounter (HOSPITAL_COMMUNITY): Payer: Self-pay | Admitting: *Deleted

## 2018-01-01 NOTE — Telephone Encounter (Signed)
Called patient regarding her upcoming surgery, no answer, left voicemail instructing her to return my call at the office ASAP

## 2018-01-02 ENCOUNTER — Ambulatory Visit (HOSPITAL_COMMUNITY)
Admission: RE | Admit: 2018-01-02 | Discharge: 2018-01-02 | Disposition: A | Discharge status: Home / Self Care | Payer: BLUE CROSS/BLUE SHIELD | Source: Ambulatory Visit | Attending: Obstetrics & Gynecology | Admitting: Obstetrics & Gynecology

## 2018-01-02 ENCOUNTER — Ambulatory Visit (HOSPITAL_COMMUNITY): Payer: BLUE CROSS/BLUE SHIELD | Admitting: Registered Nurse

## 2018-01-02 ENCOUNTER — Other Ambulatory Visit: Payer: Self-pay

## 2018-01-02 ENCOUNTER — Encounter (HOSPITAL_COMMUNITY): Payer: Self-pay | Admitting: *Deleted

## 2018-01-02 ENCOUNTER — Encounter (HOSPITAL_COMMUNITY)
Admission: RE | Disposition: A | Discharge status: Home / Self Care | Payer: Self-pay | Source: Ambulatory Visit | Attending: Obstetrics & Gynecology

## 2018-01-02 ENCOUNTER — Other Ambulatory Visit (HOSPITAL_COMMUNITY): Payer: BLUE CROSS/BLUE SHIELD

## 2018-01-02 DIAGNOSIS — N939 Abnormal uterine and vaginal bleeding, unspecified: Secondary | ICD-10-CM | POA: Diagnosis not present

## 2018-01-02 DIAGNOSIS — N926 Irregular menstruation, unspecified: Secondary | ICD-10-CM | POA: Diagnosis present

## 2018-01-02 DIAGNOSIS — Z79899 Other long term (current) drug therapy: Secondary | ICD-10-CM | POA: Insufficient documentation

## 2018-01-02 DIAGNOSIS — D259 Leiomyoma of uterus, unspecified: Secondary | ICD-10-CM | POA: Insufficient documentation

## 2018-01-02 DIAGNOSIS — D649 Anemia, unspecified: Secondary | ICD-10-CM | POA: Diagnosis present

## 2018-01-02 DIAGNOSIS — Z887 Allergy status to serum and vaccine status: Secondary | ICD-10-CM | POA: Insufficient documentation

## 2018-01-02 DIAGNOSIS — D219 Benign neoplasm of connective and other soft tissue, unspecified: Secondary | ICD-10-CM | POA: Diagnosis not present

## 2018-01-02 DIAGNOSIS — Z880 Allergy status to penicillin: Secondary | ICD-10-CM | POA: Diagnosis not present

## 2018-01-02 HISTORY — DX: Anemia, unspecified: D64.9

## 2018-01-02 HISTORY — PX: HYSTEROSCOPY W/D&C: SHX1775

## 2018-01-02 LAB — CBC
HEMATOCRIT: 35.3 % — AB (ref 36.0–46.0)
HEMOGLOBIN: 11.5 g/dL — AB (ref 12.0–15.0)
MCH: 30.3 pg (ref 26.0–34.0)
MCHC: 32.6 g/dL (ref 30.0–36.0)
MCV: 93.1 fL (ref 78.0–100.0)
Platelets: 298 10*3/uL (ref 150–400)
RBC: 3.79 MIL/uL — AB (ref 3.87–5.11)
RDW: 13 % (ref 11.5–15.5)
WBC: 4.8 10*3/uL (ref 4.0–10.5)

## 2018-01-02 LAB — TYPE AND SCREEN
ABO/RH(D): B POS
ANTIBODY SCREEN: NEGATIVE

## 2018-01-02 LAB — PREGNANCY, URINE: Preg Test, Ur: NEGATIVE

## 2018-01-02 SURGERY — DILATATION AND CURETTAGE /HYSTEROSCOPY
Anesthesia: General | Site: Vagina | Laterality: Bilateral

## 2018-01-02 MED ORDER — SODIUM CHLORIDE 0.9 % IR SOLN
Status: DC | PRN
  Administered 2018-01-02: 6000 mL

## 2018-01-02 MED ORDER — SOD CITRATE-CITRIC ACID 500-334 MG/5ML PO SOLN
ORAL | Status: AC
  Filled 2018-01-02: qty 15

## 2018-01-02 MED ORDER — BUPIVACAINE HCL (PF) 0.5 % IJ SOLN
INTRAMUSCULAR | Status: AC
  Filled 2018-01-02: qty 30

## 2018-01-02 MED ORDER — OXYCODONE HCL 5 MG PO TABS
ORAL_TABLET | ORAL | Status: AC
  Administered 2018-01-02: 5 mg via ORAL
  Filled 2018-01-02: qty 1

## 2018-01-02 MED ORDER — FENTANYL CITRATE (PF) 100 MCG/2ML IJ SOLN
INTRAMUSCULAR | Status: DC | PRN
  Administered 2018-01-02 (×4): 50 ug via INTRAVENOUS

## 2018-01-02 MED ORDER — DEXAMETHASONE SODIUM PHOSPHATE 10 MG/ML IJ SOLN
INTRAMUSCULAR | Status: AC
  Filled 2018-01-02: qty 1

## 2018-01-02 MED ORDER — OXYCODONE HCL 5 MG PO TABS
5.0000 mg | ORAL_TABLET | Freq: Once | ORAL | Status: AC | PRN
  Administered 2018-01-02: 5 mg via ORAL

## 2018-01-02 MED ORDER — METOCLOPRAMIDE HCL 5 MG/ML IJ SOLN: 10.0000 mg | Freq: Once | INTRAMUSCULAR | Status: DC | PRN

## 2018-01-02 MED ORDER — LIDOCAINE 2% (20 MG/ML) 5 ML SYRINGE
INTRAMUSCULAR | Status: DC | PRN
  Administered 2018-01-02: 100 mg via INTRAVENOUS

## 2018-01-02 MED ORDER — ACETAMINOPHEN 10 MG/ML IV SOLN
1000.0000 mg | Freq: Once | INTRAVENOUS | Status: AC
  Administered 2018-01-02: 1000 mg via INTRAVENOUS

## 2018-01-02 MED ORDER — FENTANYL CITRATE (PF) 100 MCG/2ML IJ SOLN
INTRAMUSCULAR | Status: AC
  Filled 2018-01-02: qty 2

## 2018-01-02 MED ORDER — LACTATED RINGERS IV SOLN
INTRAVENOUS | Status: DC
  Administered 2018-01-02 (×2): via INTRAVENOUS

## 2018-01-02 MED ORDER — GENTAMICIN SULFATE 40 MG/ML IJ SOLN
INTRAVENOUS | Status: AC
  Administered 2018-01-02: 30 mL via INTRAVENOUS
  Filled 2018-01-02: qty 9.5

## 2018-01-02 MED ORDER — MEPERIDINE HCL 25 MG/ML IJ SOLN: 6.2500 mg | INTRAMUSCULAR | Status: DC | PRN

## 2018-01-02 MED ORDER — LACTATED RINGERS IV SOLN: INTRAVENOUS | Status: DC

## 2018-01-02 MED ORDER — FENTANYL CITRATE (PF) 100 MCG/2ML IJ SOLN
25.0000 ug | INTRAMUSCULAR | Status: DC | PRN
  Administered 2018-01-02: 25 ug via INTRAVENOUS
  Administered 2018-01-02 (×2): 50 ug via INTRAVENOUS
  Administered 2018-01-02: 25 ug via INTRAVENOUS

## 2018-01-02 MED ORDER — FENTANYL CITRATE (PF) 100 MCG/2ML IJ SOLN
INTRAMUSCULAR | Status: AC
  Administered 2018-01-02: 25 ug via INTRAVENOUS
  Filled 2018-01-02: qty 2

## 2018-01-02 MED ORDER — KETOROLAC TROMETHAMINE 30 MG/ML IJ SOLN
INTRAMUSCULAR | Status: DC | PRN
  Administered 2018-01-02: 30 mg via INTRAVENOUS

## 2018-01-02 MED ORDER — ONDANSETRON HCL 4 MG/2ML IJ SOLN
INTRAMUSCULAR | Status: DC | PRN
  Administered 2018-01-02: 4 mg via INTRAVENOUS

## 2018-01-02 MED ORDER — OXYCODONE HCL 5 MG/5ML PO SOLN: 5.0000 mg | Freq: Once | ORAL | Status: AC | PRN

## 2018-01-02 MED ORDER — SOD CITRATE-CITRIC ACID 500-334 MG/5ML PO SOLN
30.0000 mL | ORAL | Status: AC
  Administered 2018-01-02: 30 mL via ORAL

## 2018-01-02 MED ORDER — MIDAZOLAM HCL 2 MG/2ML IJ SOLN
INTRAMUSCULAR | Status: AC
  Filled 2018-01-02: qty 2

## 2018-01-02 MED ORDER — ONDANSETRON HCL 4 MG/2ML IJ SOLN
INTRAMUSCULAR | Status: AC
  Filled 2018-01-02: qty 2

## 2018-01-02 MED ORDER — ACETAMINOPHEN 10 MG/ML IV SOLN
INTRAVENOUS | Status: AC
  Administered 2018-01-02: 1000 mg via INTRAVENOUS
  Filled 2018-01-02: qty 100

## 2018-01-02 MED ORDER — MIDAZOLAM HCL 5 MG/5ML IJ SOLN
INTRAMUSCULAR | Status: DC | PRN
  Administered 2018-01-02: 2 mg via INTRAVENOUS

## 2018-01-02 MED ORDER — BUPIVACAINE HCL (PF) 0.5 % IJ SOLN
INTRAMUSCULAR | Status: DC | PRN
  Administered 2018-01-02: 20 mL

## 2018-01-02 MED ORDER — IBUPROFEN 600 MG PO TABS
600.0000 mg | ORAL_TABLET | Freq: Four times a day (QID) | ORAL | 0 refills | Status: DC | PRN
Start: 2018-01-02 — End: 2018-01-15

## 2018-01-02 MED ORDER — OXYCODONE-ACETAMINOPHEN 5-325 MG PO TABS: 1.0000 | ORAL_TABLET | Freq: Four times a day (QID) | ORAL | 0 refills | Status: DC | PRN

## 2018-01-02 MED ORDER — LIDOCAINE HCL (CARDIAC) PF 100 MG/5ML IV SOSY
PREFILLED_SYRINGE | INTRAVENOUS | Status: AC
  Filled 2018-01-02: qty 5

## 2018-01-02 MED ORDER — FENTANYL CITRATE (PF) 100 MCG/2ML IJ SOLN
INTRAMUSCULAR | Status: AC
  Administered 2018-01-02: 50 ug via INTRAVENOUS
  Filled 2018-01-02: qty 2

## 2018-01-02 MED ORDER — PROPOFOL 10 MG/ML IV BOLUS
INTRAVENOUS | Status: AC
  Filled 2018-01-02: qty 20

## 2018-01-02 MED ORDER — PROPOFOL 10 MG/ML IV BOLUS
INTRAVENOUS | Status: DC | PRN
  Administered 2018-01-02: 200 mg via INTRAVENOUS

## 2018-01-02 MED ORDER — DEXAMETHASONE SODIUM PHOSPHATE 10 MG/ML IJ SOLN
INTRAMUSCULAR | Status: DC | PRN
  Administered 2018-01-02: 10 mg via INTRAVENOUS

## 2018-01-02 MED ORDER — SCOPOLAMINE 1 MG/3DAYS TD PT72
MEDICATED_PATCH | TRANSDERMAL | Status: AC
  Filled 2018-01-02: qty 1

## 2018-01-02 MED ORDER — SCOPOLAMINE 1 MG/3DAYS TD PT72
1.0000 | MEDICATED_PATCH | Freq: Once | TRANSDERMAL | Status: DC
  Administered 2018-01-02: 1.5 mg via TRANSDERMAL

## 2018-01-02 MED ORDER — KETOROLAC TROMETHAMINE 30 MG/ML IJ SOLN
INTRAMUSCULAR | Status: AC
  Filled 2018-01-02: qty 1

## 2018-01-02 SURGICAL SUPPLY — 15 items
ABLATOR ENDOMETRIAL BIPOLAR (ABLATOR) ×3 IMPLANT
CATH ROBINSON RED A/P 16FR (CATHETERS) ×3 IMPLANT
GLOVE BIO SURGEON STRL SZ7 (GLOVE) ×3 IMPLANT
GLOVE BIOGEL PI IND STRL 7.0 (GLOVE) ×2 IMPLANT
GLOVE BIOGEL PI INDICATOR 7.0 (GLOVE) ×4
GOWN STRL REUS W/TWL LRG LVL3 (GOWN DISPOSABLE) ×3 IMPLANT
GOWN STRL REUS W/TWL XL LVL3 (GOWN DISPOSABLE) ×3 IMPLANT
MYOSURE XL FIBROID REM (MISCELLANEOUS) ×3
PACK VAGINAL MINOR WOMEN LF (CUSTOM PROCEDURE TRAY) ×3 IMPLANT
PAD OB MATERNITY 4.3X12.25 (PERSONAL CARE ITEMS) ×3 IMPLANT
SEAL ROD LENS SCOPE MYOSURE (ABLATOR) ×3 IMPLANT
SYSTEM TISS REMOVAL MYSR XL RM (MISCELLANEOUS) ×1 IMPLANT
TOWEL OR 17X24 6PK STRL BLUE (TOWEL DISPOSABLE) ×6 IMPLANT
TUBING AQUILEX INFLOW (TUBING) ×3 IMPLANT
TUBING AQUILEX OUTFLOW (TUBING) ×3 IMPLANT

## 2018-01-02 NOTE — Brief Op Note (Signed)
01/02/2018  2:19 PM  PATIENT:  Shyra L Savarese  37 y.o. female  PRE-OPERATIVE DIAGNOSIS:  AUB Uterine Fibroids  POST-OPERATIVE DIAGNOSIS:  AUB, uterine fibroids   PROCEDURE:  Procedure(s): DILATATION AND CURETTAGE /HYSTEROSCOPY MYOSURE MYOMECTOMY AND NOVASURE ENDOMETRIAL ABLATION (Bilateral)  SURGEON:  Surgeon(s) and Role:    * Lavonia Drafts, MD - Primary  ANESTHESIA:   general  EBL:  50 mL   BLOOD ADMINISTERED:none  DRAINS: none   LOCAL MEDICATIONS USED:  MARCAINE     SPECIMEN:  Source of Specimen:  endometrila currettings adn fibroids  DISPOSITION OF SPECIMEN:  PATHOLOGY  COUNTS:  YES  TOURNIQUET:  * No tourniquets in log *  DICTATION: .Note written in EPIC  PLAN OF CARE: Discharge to home after PACU  PATIENT DISPOSITION:  PACU - hemodynamically stable.   Delay start of Pharmacological VTE agent (>24hrs) due to surgical blood loss or risk of bleeding: not applicable  Complications: none immedicate  Marlyce Mcdougald L. Harraway-Smith, M.D., [facility]

## 2018-01-02 NOTE — H&P (Signed)
Preoperative History and Physical  Stefanie Hale is a 37 y.o. [phone] here for surgical management of AUB thought to be due to fibroids  Proposed surgery: hysteroscopy with endometrial ablation using Novasure and possible myomectomy  Past Medical History:  Diagnosis Date  . Anemia   . Childhood asthma    no inhaler,  no problems as adult  . SVD (spontaneous vaginal delivery)    x 2   Past Surgical History:  Procedure Laterality Date  . TONSILLECTOMY     OB History    Gravida  2   Para  2   Term  2   Preterm      AB      Living  2     SAB      TAB      Ectopic      Multiple      Live Births             Patient denies any cervical dysplasia or STIs. Medications Prior to Admission  Medication Sig Dispense Refill Last Dose  . megestrol (MEGACE) 40 MG tablet Take 1 tablet (40 mg total) by mouth 2 (two) times daily. Can increase to two tablets twice a day in the event of heavy bleeding 60 tablet 1 Past Week at Unknown time  . traMADol (ULTRAM) 50 MG tablet TAKE 1 TABLET BY MOUTH EVERY 8 HOURS AS NEEDED FOR PAIN 20 tablet 0 Past Month at Unknown time  . tranexamic acid (LYSTEDA) 650 MG TABS tablet Take 2 tablets (1,300 mg total) by mouth 3 (three) times daily. Take during menses for a maximum of five days 30 tablet 1 Past Month at Unknown time  . azithromycin (ZITHROMAX) 250 MG tablet Use as a zpack (Patient not taking: Reported on 10/30/2017) 6 tablet 0 More than a month at Unknown time  . HYDROcodone-homatropine (HYCODAN) 5-1.5 MG/5ML syrup Take 5 mLs by mouth every 8 (eight) hours as needed for cough. (Patient not taking: Reported on 09/28/2017) 120 mL 0 More than a month at Unknown time    Allergies  Allergen Reactions  . Influenza Vaccines Other (See Comments)    Arm swelling and felt very bad.  . Penicillins     REACTION: hives   Social History:   reports that she has never smoked. She has never used smokeless tobacco. She reports that she does not  drink alcohol or use drugs. Family History  Problem Relation Age of Onset  . Arthritis Unknown   . Hypertension Unknown   . Stroke Unknown     Review of Systems: Noncontributory  PHYSICAL EXAM: Blood pressure (!) 123/92, pulse 76, temperature 98.7 F (37.1 C), temperature source Oral, resp. rate 18, height 5\' 7"  (1.702 m), weight 217 lb (98.4 kg), SpO2 99 %. General appearance - alert, well appearing, and in no distress Chest - clear to auscultation, no wheezes, rales or rhonchi, symmetric air entry Heart - normal rate and regular rhythm Abdomen - soft, nontender, nondistended, no masses or organomegaly Pelvic - examination not indicated Extremities - peripheral pulses normal, no pedal edema, no clubbing or cyanosis  Labs: Results for orders placed or performed during the hospital encounter of 01/02/18 (from the past 336 hour(s))  Pregnancy, urine   Collection Time: 01/02/18 10:52 AM  Result Value Ref Range   Preg Test, Ur NEGATIVE NEGATIVE  CBC   Collection Time: 01/02/18 10:55 AM  Result Value Ref Range   WBC 4.8 4.0 - 10.5 K/uL   RBC  3.79 (L) 3.87 - 5.11 MIL/uL   Hemoglobin 11.5 (L) 12.0 - 15.0 g/dL   HCT 35.3 (L) 36.0 - 46.0 %   MCV 93.1 78.0 - 100.0 fL   MCH 30.3 26.0 - 34.0 pg   MCHC 32.6 30.0 - 36.0 g/dL   RDW 13.0 11.5 - 15.5 %   Platelets 298 150 - 400 K/uL    Imaging Studies: 07/27/2017 CLINICAL DATA:  Initial evaluation for abnormal uterine bleeding for greater than 1 year.  EXAM: TRANSABDOMINAL AND TRANSVAGINAL ULTRASOUND OF PELVIS  TECHNIQUE: Both transabdominal and transvaginal ultrasound examinations of the pelvis were performed. Transabdominal technique was performed for global imaging of the pelvis including uterus, ovaries, adnexal regions, and pelvic cul-de-sac. It was necessary to proceed with endovaginal exam following the transabdominal exam to visualize the uterus and ovaries.  COMPARISON:  None  available.  FINDINGS: Uterus  Measurements: 11.2 x 6.4 x 7.7 cm. Multiple uterine fibroids present, 4 largest of which are measured. 2.3 x 2.3 x 2.3 cm intramural fibroid present at the left uterine fundus. 2.6 x 2.6 x 2.6 cm intramural/sub mucosal fibroid present at the right uterine fundus. 2.6 x 2.1 x 2.9 cm submucosal fibroid present at the mid uterine body. 2.1 x 2.5 x 2.2 cm intramural fibroid present at the right posterior uterine body.  Endometrium  Thickness: 5.1 mm. No definite focal abnormality, although evaluation somewhat limited by a in the submucosal fibroid. Small amount of fluid noted within the endocervical canal.  Right ovary  Measurements: 3.0 x 1.7 x 2.7 cm. Normal appearance/no adnexal mass.  Left ovary  Measurements: 3.2 x 1.8 x 2.1 cm. Normal appearance/no adnexal mass.  Other findings  Trace free physiologic fluid within the pelvis.  IMPRESSION: 1. Fibroid uterus as detailed above. 2. Endometrial stripe measures 5.1 mm without definite abnormality, although please note evaluation somewhat limited by a shadowing from adjacent uterine fibroids. If bleeding remains unresponsive to hormonal or medical therapy, sonohysterogram could be considered for focal lesion work-up. (Ref: Radiological Reasoning: Algorithmic Workup of Abnormal Vaginal Bleeding with Endovaginal Sonography and Sonohysterography. AJR 2008; [id number]). 3. Otherwise normal pelvic ultrasound.  Assessment: Patient Active Problem List   Diagnosis Date Noted  . Fibroids 01/02/2018  . Medial tibial stress syndrome, right, initial encounter 10/25/2016  . Sinusitis 07/25/2013  . Irregular menses 07/01/2013  . Unspecified contraceptive management 07/01/2013  . Back pain 01/01/2012  . Rash 12/28/2011  . Vaginal discharge 12/28/2011  . Anemia 12/28/2011  . Cervical cancer screening 12/28/2011  . Annual physical exam 11/17/2011  . SOMNOLENCE 11/24/2010  . EPISTAXIS,  RECURRENT 11/24/2010    Plan: Patient will undergo surgical management with hysteroscopy with endometrial ablation using Novasure and possible myomectomy.   The risks of surgery were discussed in detail with the patient including but not limited to: bleeding which may require transfusion or reoperation; infection which may require antibiotics; injury to surrounding organs which may involve bowel, bladder, ureters ; need for additional procedures including laparoscopy or laparotomy; thromboembolic phenomenon, surgical site problems and other postoperative/anesthesia complications. Likelihood of success in alleviating the patient's condition was discussed. Routine postoperative instructions will be reviewed with the patient and her family in detail after surgery.  The patient concurred with the proposed plan, giving informed written consent for the surgery.  Patient has been NPO since last night she will remain NPO for procedure.  Anesthesia and OR aware.  Preoperative prophylactic antibiotics and SCDs ordered on call to the OR.  To OR when ready.  Argelio Granier L. Harraway-Smith, M.D., [facility] 01/02/2018 11:22 AM

## 2018-01-02 NOTE — Transfer of Care (Signed)
Immediate Anesthesia Transfer of Care Note  Patient: Stefanie Hale  Procedure(s) Performed: DILATATION AND CURETTAGE /HYSTEROSCOPY MYOSURE MYOMECTOMY AND NOVASURE ENDOMETRIAL ABLATION (Bilateral Vagina )  Patient Location: PACU  Anesthesia Type:General  Level of Consciousness: awake, alert , oriented and patient cooperative  Airway & Oxygen Therapy: Patient Spontanous Breathing and Patient connected to nasal cannula oxygen  Post-op Assessment: Report given to RN and Post -op Vital signs reviewed and stable  Post vital signs: Reviewed and stable  Last Vitals:  Vitals Value Taken Time  BP 106/90 01/02/2018  1:45 PM  Temp    Pulse 95 01/02/2018  1:48 PM  Resp 16 01/02/2018  1:48 PM  SpO2 100 % 01/02/2018  1:48 PM  Vitals shown include unvalidated device data.  Last Pain:  Vitals:   01/02/18 1055  TempSrc: Oral  PainSc: 0-No pain      Patients Stated Pain Goal: 4 (88/33/[id number])  Complications: No apparent anesthesia complications

## 2018-01-02 NOTE — Anesthesia Preprocedure Evaluation (Signed)
Anesthesia Evaluation  Patient identified by MRN, date of birth, ID band Patient awake    Reviewed: Allergy & Precautions, NPO status , Patient's Chart, lab work & pertinent test results  Airway Mallampati: II  TM Distance: >3 FB Neck ROM: Full    Dental no notable dental hx.    Pulmonary neg pulmonary ROS,    Pulmonary exam normal breath sounds clear to auscultation       Cardiovascular negative cardio ROS Normal cardiovascular exam Rhythm:Regular Rate:Normal     Neuro/Psych negative neurological ROS  negative psych ROS   GI/Hepatic negative GI ROS, Neg liver ROS,   Endo/Other  negative endocrine ROS  Renal/GU negative Renal ROS  negative genitourinary   Musculoskeletal negative musculoskeletal ROS (+)   Abdominal   Peds negative pediatric ROS (+)  Hematology negative hematology ROS (+)   Anesthesia Other Findings   Reproductive/Obstetrics negative OB ROS                             Anesthesia Physical Anesthesia Plan  ASA: II  Anesthesia Plan: General   Post-op Pain Management:    Induction: Intravenous  PONV Risk Score and Plan: 3 and Ondansetron and Treatment may vary due to age or medical condition  Airway Management Planned: LMA  Additional Equipment:   Intra-op Plan:   Post-operative Plan: Extubation in OR  Informed Consent: I have reviewed the patients History and Physical, chart, labs and discussed the procedure including the risks, benefits and alternatives for the proposed anesthesia with the patient or authorized representative who has indicated his/her understanding and acceptance.   Dental advisory given  Plan Discussed with: CRNA  Anesthesia Plan Comments:         Anesthesia Quick Evaluation  

## 2018-01-02 NOTE — Anesthesia Postprocedure Evaluation (Signed)
Anesthesia Post Note  Patient: Stefanie Hale  Procedure(s) Performed: DILATATION AND CURETTAGE /HYSTEROSCOPY MYOSURE MYOMECTOMY AND NOVASURE ENDOMETRIAL ABLATION (Bilateral Vagina )     Patient location during evaluation: PACU Anesthesia Type: General Level of consciousness: awake and alert Pain management: pain level controlled Vital Signs Assessment: post-procedure vital signs reviewed and stable Respiratory status: spontaneous breathing, nonlabored ventilation, respiratory function stable and patient connected to nasal cannula oxygen Cardiovascular status: blood pressure returned to baseline and stable Postop Assessment: no apparent nausea or vomiting Anesthetic complications: no    Last Vitals:  Vitals:   01/02/18 1415 01/02/18 1425  BP:    Pulse: 83 81  Resp: 17 12  Temp:    SpO2: 99% 100%    Last Pain:  Vitals:   01/02/18 1425  TempSrc:   PainSc: 5    Pain Goal: Patients Stated Pain Goal: 4 (01/02/18 1055)               Montez Hageman

## 2018-01-02 NOTE — Anesthesia Procedure Notes (Signed)
Procedure Name: LMA Insertion Date/Time: 01/02/2018 12:29 PM Performed by: Talbot Grumbling, CRNA Pre-anesthesia Checklist: Patient identified, Emergency Drugs available, Suction available and Patient being monitored Patient Re-evaluated:Patient Re-evaluated prior to induction Oxygen Delivery Method: Circle system utilized Preoxygenation: Pre-oxygenation with 100% oxygen Induction Type: IV induction Ventilation: Mask ventilation without difficulty LMA: LMA inserted LMA Size: 4.0 Number of attempts: 1 Placement Confirmation: positive ETCO2 and breath sounds checked- equal and bilateral Tube secured with: Tape Dental Injury: Teeth and Oropharynx as per pre-operative assessment

## 2018-01-02 NOTE — Op Note (Signed)
01/02/2018  2:19 PM  PATIENT:  Stefanie Hale  37 y.o. female  PRE-OPERATIVE DIAGNOSIS:  AUB Uterine Fibroids  POST-OPERATIVE DIAGNOSIS:  AUB, uterine fibroids  PROCEDURE:  Procedure(s): DILATATION AND CURETTAGE /HYSTEROSCOPY MYOSURE MYOMECTOMY AND NOVASURE ENDOMETRIAL ABLATION (Bilateral)  SURGEON:  Surgeon(s) and Role:    * Lavonia Drafts, MD - Primary  ANESTHESIA:   general  EBL:  50 mL   BLOOD ADMINISTERED:none  DRAINS: none   LOCAL MEDICATIONS USED:  MARCAINE     SPECIMEN:  Source of Specimen:  endometrila currettings adn fibroids  DISPOSITION OF SPECIMEN:  PATHOLOGY  COUNTS:  YES  TOURNIQUET:  * No tourniquets in log *  DICTATION: .Note written in EPIC  PLAN OF CARE: Discharge to home after PACU  PATIENT DISPOSITION:  PACU - hemodynamically stable.   Delay start of Pharmacological VTE agent (>24hrs) due to surgical blood loss or risk of bleeding: not applicable  Complications: none immediate  NDICATIONS: The patient is a 37 yo G2P2 with a h/o symptomatic uterine fibroids/menorrhagia. The patient made a decision to undergo treatment.  She was schedueld for a Vaginal hysterectomy but, reports that she cannot miss time from work so opts for the less invasive approach . On the preoperative visit, the risks, benefits, indications, and alternatives of the procedure were reviewed with the patient.  On the day of surgery, the risks of surgery were again discussed with the patient including but not limited to: bleeding which may require transfusion or reoperation; infection which may require antibiotics; injury to bowel, bladder, ureters or other surrounding organs; need for additional procedures; thromboembolic phenomenon, incisional problems and other postoperative/anesthesia complications. Written informed consent was obtained.    PROCEDURE DETAILS:  The patient was taken to the operating room where general anesthesia was administered and was found to be  adequate.  After an adequate timeout was performed, she was placed in the dorsal lithotomy position and examined; then prepped and draped in the sterile manner.   Her bladder was catheterized for an unmeasured amount of clear, yellow urine. A speculum was then placed in the patient's vagina and a single tooth tenaculum was applied to the anterior lip of the cervix.   A paracervical block using 20 ml of 0.5% Marcaine was administered.  The cervix was sounded to 11 cm and dilated manually with metal dilators to accommodate the Myosure operative hysteroscope.  Once the cervix was dilated, the hysteroscope was inserted under direct visualization using LR as a suspension medium.  The uterine cavity was carefully examined, both ostia were recognized, and diffusely proliferative endometrium with the fibroid above were noted.   After further careful visualization of the uterine cavity, the hysteroscope was removed under direct visualization.  A sharp curettage was then performed to obtain a minimal amount of endometrial curettings. The hysteroscopy was then reinserted and the fibroid was resected using the Myosure device. The hysteroscope was removed. The endometrial cavity was sounded to 11cm and the endocervical length measured 4.5cm.   The NovaSure device was then inserted and seated using 6.5cm as the cavity length and 2.7cm as the cavity width.  The total activation time was 47 sec at a power of 164.  The hysteroscope was reinserted and an even burn pattern was noted to the fundus.  The single toothed tenaculum was removed at the end of the case and no bleeding was noted from the cervix.  The patient tolerated the procedure well and was taken to the recovery area awake, extubated and in  stable condition.  Specimens were sent to pathology.  Lakin Rhine L. Harraway-Smith, M.D., [facility]

## 2018-01-02 NOTE — Discharge Instructions (Signed)
Hysteroscopy, Care After Refer to this sheet in the next few weeks. These instructions provide you with information on caring for yourself after your procedure. Your health care provider may also give you more specific instructions. Your treatment has been planned according to current medical practices, but problems sometimes occur. Call your health care provider if you have any problems or questions after your procedure. What can I expect after the procedure? After your procedure, it is typical to have the following:  You may have some cramping. This normally lasts for a couple days.  You may have bleeding. This can vary from light spotting for a few days to menstrual-like bleeding for 3-7 days.  Follow these instructions at home:  Rest for the first 1-2 days after the procedure.  Only take over-the-counter or prescription medicines as directed by your health care provider. Do not take aspirin. It can increase the chances of bleeding.  Take showers instead of baths for 2 weeks or as directed by your health care provider.  Do not drive for 24 hours or as directed.  Do not drink alcohol while taking pain medicine.  Do not use tampons, douche, or have sexual intercourse for 2 weeks or until your health care provider says it is okay.  Take your temperature twice a day for 4-5 days. Write it down each time.  Follow your health care provider's advice about diet, exercise, and lifting.  If you develop constipation, you may: ? Take a mild laxative if your health care provider approves. ? Add bran foods to your diet. ? Drink enough fluids to keep your urine clear or pale yellow.  Try to have someone with you or available to you for the first 24-48 hours, especially if you were given a general anesthetic.  Follow up with your health care provider as directed. Contact a health care provider if:  You feel dizzy or lightheaded.  You feel sick to your stomach (nauseous).  You have  abnormal vaginal discharge.  You have a rash.  You have pain that is not controlled with medicine. Get help right away if:  You have bleeding that is heavier than a normal menstrual period.  You have a fever.  You have increasing cramps or pain, not controlled with medicine.  You have new belly (abdominal) pain.  You pass out.  You have pain in the tops of your shoulders (shoulder strap areas).  You have shortness of breath. This information is not intended to replace advice given to you by your health care provider. Make sure you discuss any questions you have with your health care provider. Document Released: 06/19/2013 Document Revised: 02/04/2016 Document Reviewed: 03/28/2013 Elsevier Interactive Patient Education  2017 [facility] Instructions  Activity: Get plenty of rest for the remainder of the day. A responsible individual must stay with you for 24 hours following the procedure.  For the next 24 hours, DO NOT: -Drive a car -Paediatric nurse -Drink alcoholic beverages -Take any medication unless instructed by your physician -Make any legal decisions or sign important papers.  Meals: Start with liquid foods such as gelatin or soup. Progress to regular foods as tolerated. Avoid greasy, spicy, heavy foods. If nausea and/or vomiting occur, drink only clear liquids until the nausea and/or vomiting subsides. Call your physician if vomiting continues.  Special Instructions/Symptoms: Your throat may feel dry or sore from the anesthesia or the breathing tube placed in your throat during surgery. If this causes discomfort, gargle  with warm salt water. The discomfort should disappear within 24 hours. ° °If you had a scopolamine patch placed behind your ear for the management of post- operative nausea and/or vomiting: ° °1. The medication in the patch is effective for 72 hours, after which it should be removed.  Wrap patch in a tissue and discard in  the trash. Wash hands thoroughly with soap and water. °2. You may remove the patch earlier than 72 hours if you experience unpleasant side effects which may include dry mouth, dizziness or visual disturbances. °3. Avoid touching the patch. Wash your hands with soap and water after contact with the patch. °  ° ° °

## 2018-01-03 ENCOUNTER — Encounter (HOSPITAL_COMMUNITY): Payer: Self-pay | Admitting: Obstetrics & Gynecology

## 2018-01-03 LAB — ABO/RH: ABO/RH(D): B POS

## 2018-01-04 ENCOUNTER — Other Ambulatory Visit: Payer: Self-pay | Admitting: Family Medicine

## 2018-01-06 ENCOUNTER — Other Ambulatory Visit: Payer: Self-pay | Admitting: Family Medicine

## 2018-01-12 ENCOUNTER — Encounter: Payer: Self-pay | Admitting: Obstetrics & Gynecology

## 2018-01-12 ENCOUNTER — Other Ambulatory Visit: Payer: Self-pay | Admitting: Obstetrics & Gynecology

## 2018-01-14 ENCOUNTER — Encounter: Payer: Self-pay | Admitting: Obstetrics & Gynecology

## 2018-01-14 ENCOUNTER — Encounter (HOSPITAL_BASED_OUTPATIENT_CLINIC_OR_DEPARTMENT_OTHER): Payer: Self-pay | Admitting: *Deleted

## 2018-01-14 ENCOUNTER — Other Ambulatory Visit: Payer: Self-pay

## 2018-01-14 ENCOUNTER — Emergency Department (HOSPITAL_BASED_OUTPATIENT_CLINIC_OR_DEPARTMENT_OTHER): Payer: BLUE CROSS/BLUE SHIELD

## 2018-01-14 ENCOUNTER — Emergency Department (HOSPITAL_BASED_OUTPATIENT_CLINIC_OR_DEPARTMENT_OTHER)
Admission: EM | Admit: 2018-01-14 | Discharge: 2018-01-14 | Disposition: A | Discharge status: Home / Self Care | Payer: BLUE CROSS/BLUE SHIELD | Attending: Emergency Medicine | Admitting: Emergency Medicine

## 2018-01-14 DIAGNOSIS — Z79899 Other long term (current) drug therapy: Secondary | ICD-10-CM | POA: Insufficient documentation

## 2018-01-14 DIAGNOSIS — R1030 Lower abdominal pain, unspecified: Secondary | ICD-10-CM | POA: Diagnosis present

## 2018-01-14 DIAGNOSIS — G8918 Other acute postprocedural pain: Secondary | ICD-10-CM | POA: Diagnosis not present

## 2018-01-14 LAB — CBC WITH DIFFERENTIAL/PLATELET
Basophils Absolute: 0 10*3/uL (ref 0.0–0.1)
Basophils Relative: 1 %
Eosinophils Absolute: 0.1 10*3/uL (ref 0.0–0.7)
Eosinophils Relative: 2 %
HCT: 34.7 % — ABNORMAL LOW (ref 36.0–46.0)
Hemoglobin: 11.9 g/dL — ABNORMAL LOW (ref 12.0–15.0)
LYMPHS ABS: 2.5 10*3/uL (ref 0.7–4.0)
LYMPHS PCT: 40 %
MCH: 31.3 pg (ref 26.0–34.0)
MCHC: 34.3 g/dL (ref 30.0–36.0)
MCV: 91.3 fL (ref 78.0–100.0)
MONOS PCT: 9 %
Monocytes Absolute: 0.5 10*3/uL (ref 0.1–1.0)
Neutro Abs: 3.1 10*3/uL (ref 1.7–7.7)
Neutrophils Relative %: 48 %
PLATELETS: 306 10*3/uL (ref 150–400)
RBC: 3.8 MIL/uL — AB (ref 3.87–5.11)
RDW: 12 % (ref 11.5–15.5)
WBC: 6.2 10*3/uL (ref 4.0–10.5)

## 2018-01-14 LAB — URINALYSIS, MICROSCOPIC (REFLEX)

## 2018-01-14 LAB — BASIC METABOLIC PANEL
Anion gap: 10 (ref 5–15)
BUN: 19 mg/dL (ref 6–20)
CHLORIDE: 107 mmol/L (ref 101–111)
CO2: 20 mmol/L — ABNORMAL LOW (ref 22–32)
Calcium: 9.2 mg/dL (ref 8.9–10.3)
Creatinine, Ser: 0.92 mg/dL (ref 0.44–1.00)
GFR calc Af Amer: >60 mL/min (ref >60)
GFR calc non Af Amer: >60 mL/min (ref >60)
GLUCOSE: 96 mg/dL (ref 65–99)
POTASSIUM: 3.7 mmol/L (ref 3.5–5.1)
Sodium: 137 mmol/L (ref 135–145)

## 2018-01-14 LAB — URINALYSIS, ROUTINE W REFLEX MICROSCOPIC
Bilirubin Urine: NEGATIVE
GLUCOSE, UA: NEGATIVE mg/dL
KETONES UR: 15 mg/dL — AB
LEUKOCYTES UA: NEGATIVE
NITRITE: NEGATIVE
PROTEIN: NEGATIVE mg/dL
Specific Gravity, Urine: >1.03 — ABNORMAL HIGH (ref 1.005–1.030)
pH: 6 (ref 5.0–8.0)

## 2018-01-14 LAB — PREGNANCY, URINE: PREG TEST UR: NEGATIVE

## 2018-01-14 MED ORDER — KETOROLAC TROMETHAMINE 30 MG/ML IJ SOLN
30.0000 mg | Freq: Once | INTRAMUSCULAR | Status: AC
  Administered 2018-01-14: 30 mg via INTRAMUSCULAR
  Filled 2018-01-14: qty 1

## 2018-01-14 MED ORDER — OXYCODONE-ACETAMINOPHEN 5-325 MG PO TABS: 1.0000 | ORAL_TABLET | Freq: Four times a day (QID) | ORAL | 0 refills | Status: DC | PRN

## 2018-01-14 MED ORDER — IOPAMIDOL (ISOVUE-300) INJECTION 61%
100.0000 mL | Freq: Once | INTRAVENOUS | Status: AC | PRN
  Administered 2018-01-14: 100 mL via INTRAVENOUS

## 2018-01-14 NOTE — ED Provider Notes (Signed)
[facility] EMERGENCY DEPARTMENT Provider Note   CSN: [id number] Arrival date & time: 01/14/18  0603     History   Chief Complaint Chief Complaint  Patient presents with  . Abdominal Pain    HPI Stefanie Hale is a 37 y.o. female.  HPI  This is a 37 year old female who presents with vaginal bleeding and cramping.  Patient reports that she had an ablation on Bralynn 23.  She states that since that time she has had progressively worsening abdominal cramping.  Additionally she states that she continues to have "bleeding like a period."  She reports that she has used up to 4 pads per day.  She reports crampy nonradiating lower abdominal pain.  She has taken ibuprofen with minimal relief.  She states that the cramping is sometimes so intense that she has a hard time standing up.  It is interfering with her work.  She denies any urinary symptoms or fevers.  Currently she rates her pain at 7 out of 10.  Past Medical History:  Diagnosis Date  . Anemia   . Childhood asthma    no inhaler,  no problems as adult  . SVD (spontaneous vaginal delivery)    x 2    Patient Active Problem List   Diagnosis Date Noted  . Fibroids 01/02/2018  . Medial tibial stress syndrome, right, initial encounter 10/25/2016  . Sinusitis 07/25/2013  . Irregular menses 07/01/2013  . Unspecified contraceptive management 07/01/2013  . Back pain 01/01/2012  . Rash 12/28/2011  . Vaginal discharge 12/28/2011  . Anemia 12/28/2011  . Cervical cancer screening 12/28/2011  . Annual physical exam 11/17/2011  . SOMNOLENCE 11/24/2010  . EPISTAXIS, RECURRENT 11/24/2010    Past Surgical History:  Procedure Laterality Date  . HYSTEROSCOPY W/D&C Bilateral 01/02/2018   Procedure: DILATATION AND CURETTAGE /HYSTEROSCOPY MYOSURE MYOMECTOMY AND NOVASURE ENDOMETRIAL ABLATION;  Surgeon: Lavonia Drafts, MD;  Location: [facility];  Service: Gynecology;  Laterality: Bilateral;  . TONSILLECTOMY       OB  History    Gravida  2   Para  2   Term  2   Preterm      AB      Living  2     SAB      TAB      Ectopic      Multiple      Live Births               Home Medications    Prior to Admission medications   Medication Sig Start Date End Date Taking? Authorizing Provider  ibuprofen (ADVIL,MOTRIN) 600 MG tablet Take 1 tablet (600 mg total) by mouth every 6 (six) hours as needed. 01/02/18   Lavonia Drafts, MD  oxyCODONE-acetaminophen (PERCOCET/ROXICET) 5-325 MG tablet Take 1 tablet by mouth every 6 (six) hours as needed. 01/02/18   Lavonia Drafts, MD  Vitamin D, Ergocalciferol, (DRISDOL) 50000 units CAPS capsule TAKE 1 CAPSULE BY MOUTH EVERY 7 DAYS 01/08/18   Lyndal Pulley, DO    Family History Family History  Problem Relation Age of Onset  . Arthritis Unknown   . Hypertension Unknown   . Stroke Unknown     Social History Social History   Tobacco Use  . Smoking status: Never Smoker  . Smokeless tobacco: Never Used  Substance Use Topics  . Alcohol use: Never    Frequency: Never  . Drug use: No     Allergies   Influenza vaccines and Penicillins  Review of Systems Review of Systems  Constitutional: Negative for fever.  Respiratory: Negative for shortness of breath.   Cardiovascular: Negative for chest pain.  Gastrointestinal: Positive for abdominal pain and constipation. Negative for nausea and vomiting.  Genitourinary: Positive for vaginal bleeding. Negative for dysuria and hematuria.  All other systems reviewed and are negative.    Physical Exam Updated Vital Signs BP 114/82   Pulse 80   Temp 99 F (37.2 C)   Resp 18   Ht 5\' 7"  (1.702 m)   Wt 97.6 kg (215 lb 1 oz)   LMP  (LMP Unknown) Comment: Cont. Bleeding  BMI 33.68 kg/m   Physical Exam  Constitutional: She is oriented to person, place, and time. She appears well-developed and well-nourished.  Obese, no acute distress  HENT:  Head: Normocephalic and atraumatic.    Neck: Neck supple.  Cardiovascular: Normal rate, regular rhythm and normal heart sounds.  Pulmonary/Chest: Effort normal. No respiratory distress. She has no wheezes.  Abdominal: Soft. Normal appearance and bowel sounds are normal. There is tenderness in the suprapubic area. There is no rebound and no guarding.  Genitourinary: Vagina normal.  Genitourinary Comments: External vaginal exam normal, small amount of blood pooling at the cervix, no clots noted, diffuse fundal tenderness to palpation  Neurological: She is alert and oriented to person, place, and time.  Skin: Skin is warm and dry.  Psychiatric: She has a normal mood and affect.  Nursing note and vitals reviewed.    ED Treatments / Results  Labs (all labs ordered are listed, but only abnormal results are displayed) Labs Reviewed  URINALYSIS, ROUTINE W REFLEX MICROSCOPIC - Abnormal; Notable for the following components:      Result Value   Specific Gravity, Urine >1.030 (*)    Hgb urine dipstick LARGE (*)    Ketones, ur 15 (*)    All other components within normal limits  URINALYSIS, MICROSCOPIC (REFLEX) - Abnormal; Notable for the following components:   Bacteria, UA FEW (*)    All other components within normal limits  CBC WITH DIFFERENTIAL/PLATELET - Abnormal; Notable for the following components:   RBC 3.80 (*)    Hemoglobin 11.9 (*)    HCT 34.7 (*)    All other components within normal limits  BASIC METABOLIC PANEL - Abnormal; Notable for the following components:   CO2 20 (*)    All other components within normal limits  PREGNANCY, URINE    EKG None  Radiology No results found.  Procedures Procedures (including critical care time)  Medications Ordered in ED Medications  ketorolac (TORADOL) 30 MG/ML injection 30 mg (30 mg Intramuscular Given 01/14/18 0644)     Initial Impression / Assessment and Plan / ED Course  I have reviewed the triage vital signs and the nursing notes.  Pertinent labs & imaging  results that were available during my care of the patient were reviewed by me and considered in my medical decision making (see chart for details).     Patient presents with lower abdominal pain that is nonlateralizing.  This is post myomectomy and ablation.  She is overall nontoxic-appearing vital signs are reassuring.  She does have a diffusely tender uterine fundus.  She has some vaginal bleeding but does not appear to have extensive bleeding as hemoglobin is stable.  No leukocytosis to suggest infection.  She has also been afebrile.  Discussed with Dr. Ilda Basset, OB/GYN.  Recommending CT scan to rule out perforation/location given worsening pain even with reassuring laboratory  work-up and exam.  Signed out to oncoming Dr.  Final Clinical Impressions(s) / ED Diagnoses   Final diagnoses:  Acute post-operative pain    ED Discharge Orders    None       Merryl Hacker, MD 01/14/18 2313

## 2018-01-14 NOTE — ED Notes (Signed)
EDP into room, prior to RN assessment, see MD notes, pending orders.   

## 2018-01-14 NOTE — Discharge Instructions (Signed)
In addition to Percocet you can always take 2 or 3 ibuprofen as well for the pain

## 2018-01-14 NOTE — ED Provider Notes (Signed)
Assumed care from Dr. Dina Rich at 730 patient awaiting CT scan.   CLINICAL DATA: 37 year old female with history of lower abdominal pain and bleeding following endometrial ablation on 01/02/2017. No fevers or chills. No nausea, vomiting or diarrhea.  EXAM: CT ABDOMEN AND PELVIS WITH CONTRAST  TECHNIQUE: Multidetector CT imaging of the abdomen and pelvis was performed using the standard protocol following bolus administration of intravenous contrast.  CONTRAST: 191mL ISOVUE-300 IOPAMIDOL (ISOVUE-300) INJECTION 61%  COMPARISON: No priors.  FINDINGS: Lower chest: Unremarkable.  Hepatobiliary: No suspicious appearing cystic or solid hepatic lesions. No intra or extrahepatic biliary ductal dilatation. Gallbladder is normal in appearance.  Pancreas: No pancreatic mass. No pancreatic ductal dilatation. No pancreatic or peripancreatic fluid or inflammatory changes.  Spleen: Unremarkable.  Adrenals/Urinary Tract: Bilateral kidneys and bilateral adrenal glands are normal in appearance. No hydroureteronephrosis. Urinary bladder is nearly decompressed, but otherwise unremarkable in appearance.  Stomach/Bowel: Appearance of the stomach is normal. There is no pathologic dilatation of small bowel or colon. Normal appendix.  Vascular/Lymphatic: No significant atherosclerotic disease, aneurysm or dissection noted in the abdominal or pelvic vasculature. No lymphadenopathy noted in the abdomen or pelvis.  Reproductive: Uterus is enlarged and heterogeneous in appearance with multiple lesions demonstrating varying degrees of enhancement. The largest of these lesions is in the right side of the uterine fundus measuring 3.0 x 2.5 x 3.2 cm (axial image 66 of series 2 and sagittal image 62 of series 6), and is predominantly hypovascular in appearance. Several other smaller more hypervascular lesions are also noted. These findings all likely reflect small fibroids. Ovaries are unremarkable in  appearance.  Other: Trace volume of free fluid in the cul-de-sac, presumably physiologic. No larger volume of ascites. No pneumoperitoneum.  Musculoskeletal: There are no aggressive appearing lytic or blastic lesions noted in the visualized portions of the skeleton.  IMPRESSION: 1. Fibroid uterus, as discussed above. 2. Trace amount of free fluid in the cul-de-sacs, presumably physiologic in this young female patient. 3. No other acute findings are noted elsewhere in the abdomen or pelvis to account for the patient's symptoms. 4. Normal appendix.     Patient CT without acute findings.  Patient given a few more days of pain control and urged to follow-up with her PCP    Blanchie Dessert, MD 01/14/18 [phone]

## 2018-01-14 NOTE — ED Notes (Signed)
Alert, NAD, calm, interactive, resps e/u, speaking in clear complete sentences, no dyspnea noted, skin W&D, VSS, c/o abd/pelvic pain and vaginal bleeding, gradually worse since endometrial ablation, mentions urinary frequency, (denies: back pain, fever, urgency, vaginal d/c, NVD, sob, dizziness, "feeling cold", weakness, or visual changes). No recent intercourse. Last BM 2d ago (constipated).

## 2018-01-14 NOTE — ED Triage Notes (Addendum)
Pt c/o abd cramping and vaginal bleeding since she had an endometrial ablation. Procedure was done on 4-23. States has been using 3 pads per day.  Pt sent her MD a message but she has not heard back. Has taken prescribed pain as ordered. Denies any n/v/ or fevers. Pt states abd cramping is constant.

## 2018-01-15 ENCOUNTER — Telehealth: Payer: Self-pay

## 2018-01-15 MED ORDER — IBUPROFEN 600 MG PO TABS: 600.0000 mg | ORAL_TABLET | Freq: Four times a day (QID) | ORAL | 0 refills | Status: DC | PRN

## 2018-01-15 NOTE — Telephone Encounter (Signed)
Attempted to reach patient by phone per provider request to answer patient my chart message.   No answer. Will send mychart message. [name] RN

## 2018-01-15 NOTE — Telephone Encounter (Signed)
My Chart message sent. [name] RN

## 2018-01-15 NOTE — Telephone Encounter (Signed)
Patient requesting refill on 600mg  ibuprofen. Refill sent per Dr. Ihor Dow. [name] RN

## 2018-01-30 ENCOUNTER — Telehealth: Payer: Self-pay

## 2018-01-30 ENCOUNTER — Encounter: Payer: Self-pay | Admitting: Obstetrics & Gynecology

## 2018-01-30 NOTE — Telephone Encounter (Signed)
Patient left message that she would like to cancel her post op tomorrow Wednesday May 22nd.  Attempted to reach patient back and left message letting her know I canceled the appointment and she can call back to reschedule. [name] RN

## 2018-01-31 ENCOUNTER — Encounter: Payer: BLUE CROSS/BLUE SHIELD | Admitting: Obstetrics & Gynecology

## 2018-02-03 ENCOUNTER — Encounter: Payer: Self-pay | Admitting: Obstetrics & Gynecology

## 2018-02-07 ENCOUNTER — Encounter: Payer: Self-pay | Admitting: Obstetrics & Gynecology

## 2018-02-07 ENCOUNTER — Ambulatory Visit (INDEPENDENT_AMBULATORY_CARE_PROVIDER_SITE_OTHER): Payer: BLUE CROSS/BLUE SHIELD | Admitting: Obstetrics & Gynecology

## 2018-02-07 VITALS — BP 109/72 | HR 80 | Ht 67.0 in | Wt 220.4 lb

## 2018-02-07 DIAGNOSIS — Z9889 Other specified postprocedural states: Secondary | ICD-10-CM | POA: Diagnosis not present

## 2018-02-07 NOTE — Progress Notes (Signed)
Pt states that she had some bleeding like a period four days last week. Pt states that the bleeding stopped on Monday.

## 2018-02-07 NOTE — Progress Notes (Signed)
History:  37 y.o. [id number] here today for her post op check she is s/p hysteroscopy with resection of fibroid and endometrial ablation on 01/02/2018. Pt reports that she has no pain and her bleeding has stopped after 2 weeks of spotting.   The following portions of the patient's history were reviewed and updated as appropriate: allergies, current medications, past family history, past medical history, past social history, past surgical history and problem list.  Review of Systems:  Pertinent items are noted in HPI.    Objective:  Physical Exam Blood pressure 109/72, pulse 80, height 5\' 7"  (1.702 m), weight 220 lb 6.4 oz (100 kg). Gen: NAD Abd: Soft, nontender and nondistended Pelvic: Normal appearing external genitalia; normal appearing vaginal mucosa and cervix.  Normal discharge.  Small uterus, no other palpable masses, no uterine or adnexal tenderness  Labs and Imaging Ct Abdomen Pelvis W Contrast  Result Date: 01/14/2018 CLINICAL DATA:  37 year old female with history of lower abdominal pain and bleeding following endometrial ablation on 01/02/2017. No fevers or chills. No nausea, vomiting or diarrhea. EXAM: CT ABDOMEN AND PELVIS WITH CONTRAST TECHNIQUE: Multidetector CT imaging of the abdomen and pelvis was performed using the standard protocol following bolus administration of intravenous contrast. CONTRAST:  165mL ISOVUE-300 IOPAMIDOL (ISOVUE-300) INJECTION 61% COMPARISON:  No priors. FINDINGS: Lower chest: Unremarkable. Hepatobiliary: No suspicious appearing cystic or solid hepatic lesions. No intra or extrahepatic biliary ductal dilatation. Gallbladder is normal in appearance. Pancreas: No pancreatic mass. No pancreatic ductal dilatation. No pancreatic or peripancreatic fluid or inflammatory changes. Spleen: Unremarkable. Adrenals/Urinary Tract: Bilateral kidneys and bilateral adrenal glands are normal in appearance. No hydroureteronephrosis. Urinary bladder is nearly decompressed, but  otherwise unremarkable in appearance. Stomach/Bowel: Appearance of the stomach is normal. There is no pathologic dilatation of small bowel or colon. Normal appendix. Vascular/Lymphatic: No significant atherosclerotic disease, aneurysm or dissection noted in the abdominal or pelvic vasculature. No lymphadenopathy noted in the abdomen or pelvis. Reproductive: Uterus is enlarged and heterogeneous in appearance with multiple lesions demonstrating varying degrees of enhancement. The largest of these lesions is in the right side of the uterine fundus measuring 3.0 x 2.5 x 3.2 cm (axial image 66 of series 2 and sagittal image 62 of series 6), and is predominantly hypovascular in appearance. Several other smaller more hypervascular lesions are also noted. These findings all likely reflect small fibroids. Ovaries are unremarkable in appearance. Other: Trace volume of free fluid in the cul-de-sac, presumably physiologic. No larger volume of ascites. No pneumoperitoneum. Musculoskeletal: There are no aggressive appearing lytic or blastic lesions noted in the visualized portions of the skeleton. IMPRESSION: 1. Fibroid uterus, as discussed above. 2. Trace amount of free fluid in the cul-de-sacs, presumably physiologic in this young female patient. 3. No other acute findings are noted elsewhere in the abdomen or pelvis to account for the patient's symptoms. 4. Normal appendix. Electronically Signed   By: Vinnie Langton M.D.   On: 01/14/2018 08:28   01/02/2018 Diagnosis Endometrium, curettage - BENIGN SMOOTH MUSCLE CONSISTENT WITH LEIOMYOMA. - SCANT INACTIVE ENDOMETRIUM. - NO HYPERPLASIA OR MALIGNANCY.  Assessment & Plan:  4 week post op check doing well  Reviewed surg path F/u in 3 months or sooner prn

## 2018-02-12 ENCOUNTER — Encounter: Payer: Self-pay | Admitting: Family Medicine

## 2018-02-12 NOTE — Telephone Encounter (Signed)
Called left voicemail for pt to call us back to schedule appt.

## 2018-02-13 ENCOUNTER — Ambulatory Visit: Payer: BLUE CROSS/BLUE SHIELD | Admitting: Medical

## 2018-02-13 ENCOUNTER — Encounter: Payer: Self-pay | Admitting: Medical

## 2018-02-13 DIAGNOSIS — Z0289 Encounter for other administrative examinations: Secondary | ICD-10-CM

## 2018-03-13 ENCOUNTER — Encounter: Payer: Self-pay | Admitting: Obstetrics & Gynecology

## 2018-03-14 ENCOUNTER — Telehealth: Payer: Self-pay | Admitting: Obstetrics & Gynecology

## 2018-03-14 NOTE — Telephone Encounter (Signed)
Tc to pt to discuss bleeding that she sent a MyChart message about. No answer. Left message.   Marcelino Campos L. Harraway-Smith, M.D., Cherlynn June

## 2018-04-01 ENCOUNTER — Other Ambulatory Visit: Payer: Self-pay

## 2018-04-01 ENCOUNTER — Encounter (HOSPITAL_BASED_OUTPATIENT_CLINIC_OR_DEPARTMENT_OTHER): Payer: Self-pay | Admitting: Emergency Medicine

## 2018-04-01 ENCOUNTER — Emergency Department (HOSPITAL_BASED_OUTPATIENT_CLINIC_OR_DEPARTMENT_OTHER)
Admission: EM | Admit: 2018-04-01 | Discharge: 2018-04-01 | Disposition: A | Discharge status: Home / Self Care | Payer: BLUE CROSS/BLUE SHIELD | Attending: Emergency Medicine | Admitting: Emergency Medicine

## 2018-04-01 DIAGNOSIS — J45909 Unspecified asthma, uncomplicated: Secondary | ICD-10-CM | POA: Diagnosis not present

## 2018-04-01 DIAGNOSIS — Z79899 Other long term (current) drug therapy: Secondary | ICD-10-CM | POA: Insufficient documentation

## 2018-04-01 DIAGNOSIS — J069 Acute upper respiratory infection, unspecified: Secondary | ICD-10-CM | POA: Insufficient documentation

## 2018-04-01 DIAGNOSIS — R05 Cough: Secondary | ICD-10-CM | POA: Diagnosis present

## 2018-04-01 DIAGNOSIS — B9789 Other viral agents as the cause of diseases classified elsewhere: Secondary | ICD-10-CM

## 2018-04-01 LAB — RAPID STREP SCREEN (MED CTR MEBANE ONLY): Streptococcus, Group A Screen (Direct): NEGATIVE

## 2018-04-01 NOTE — ED Triage Notes (Signed)
Sore throat and cough x 3 days

## 2018-04-01 NOTE — ED Provider Notes (Signed)
[facility] EMERGENCY DEPARTMENT Provider Note   CSN: [id number] Arrival date & time: 04/01/18  0745     History   Chief Complaint Chief Complaint  Patient presents with  . Cough  . Sore Throat    HPI Stefanie Hale is a 37 y.o. female.  37 year old female with past medical history below who presents with cough and sore throat.  Patient reports 3 days of cough associated with sore throat, mild nasal congestion.  No fevers, breathing problems, vomiting, diarrhea, rash, sick contacts, or recent travel.  She has tried a few over-the-counter medications including Mucinex.  The history is provided by the patient.  Cough   Sore Throat     Past Medical History:  Diagnosis Date  . Anemia   . Childhood asthma    no inhaler,  no problems as adult  . SVD (spontaneous vaginal delivery)    x 2    Patient Active Problem List   Diagnosis Date Noted  . Fibroids 01/02/2018  . Medial tibial stress syndrome, right, initial encounter 10/25/2016  . Sinusitis 07/25/2013  . Irregular menses 07/01/2013  . Unspecified contraceptive management 07/01/2013  . Back pain 01/01/2012  . Rash 12/28/2011  . Vaginal discharge 12/28/2011  . Anemia 12/28/2011  . Cervical cancer screening 12/28/2011  . Annual physical exam 11/17/2011  . SOMNOLENCE 11/24/2010  . EPISTAXIS, RECURRENT 11/24/2010    Past Surgical History:  Procedure Laterality Date  . HYSTEROSCOPY W/D&C Bilateral 01/02/2018   Procedure: DILATATION AND CURETTAGE /HYSTEROSCOPY MYOSURE MYOMECTOMY AND NOVASURE ENDOMETRIAL ABLATION;  Surgeon: Lavonia Drafts, MD;  Location: [facility];  Service: Gynecology;  Laterality: Bilateral;  . TONSILLECTOMY       OB History    Gravida  2   Para  2   Term  2   Preterm      AB      Living  2     SAB      TAB      Ectopic      Multiple      Live Births               Home Medications    Prior to Admission medications   Medication Sig Start Date  End Date Taking? Authorizing Provider  ibuprofen (ADVIL,MOTRIN) 600 MG tablet Take 1 tablet (600 mg total) by mouth every 6 (six) hours as needed. 01/15/18   Lavonia Drafts, MD  oxyCODONE-acetaminophen (PERCOCET/ROXICET) 5-325 MG tablet Take 1-2 tablets by mouth every 6 (six) hours as needed for severe pain. Patient not taking: Reported on 02/07/2018 01/14/18   Blanchie Dessert, MD  Vitamin D, Ergocalciferol, (DRISDOL) 50000 units CAPS capsule TAKE 1 CAPSULE BY MOUTH EVERY 7 DAYS Patient not taking: Reported on 02/07/2018 01/08/18   Lyndal Pulley, DO    Family History Family History  Problem Relation Age of Onset  . Arthritis Unknown   . Hypertension Unknown   . Stroke Unknown     Social History Social History   Tobacco Use  . Smoking status: Never Smoker  . Smokeless tobacco: Never Used  Substance Use Topics  . Alcohol use: Never    Frequency: Never  . Drug use: No     Allergies   Influenza vaccines and Penicillins   Review of Systems Review of Systems  Respiratory: Positive for cough.    All other systems reviewed and are negative except that which was mentioned in HPI   Physical Exam Updated Vital Signs BP 124/90 (BP Location:  Right Arm)   Pulse 79   Temp 98.7 F (37.1 C) (Oral)   Resp 18   Ht 5\' 7"  (1.702 m)   Wt 98 kg (216 lb)   SpO2 100%   BMI 33.83 kg/m   Physical Exam  Constitutional: She is oriented to person, place, and time. She appears well-developed and well-nourished. No distress.  HENT:  Head: Normocephalic and atraumatic.  Mouth/Throat: Uvula is midline and oropharynx is clear and moist. No oral lesions. No uvula swelling. No oropharyngeal exudate, posterior oropharyngeal edema or posterior oropharyngeal erythema.  Moist mucous membranes Tonsils surgically absent  Eyes: Conjunctivae are normal.  Neck: Neck supple.  Cardiovascular: Normal rate, regular rhythm and normal heart sounds.  No murmur heard. Pulmonary/Chest: Effort normal  and breath sounds normal.  Abdominal: Soft. Bowel sounds are normal. She exhibits no distension. There is no tenderness.  Musculoskeletal: She exhibits no edema.  Lymphadenopathy:    She has no cervical adenopathy.  Neurological: She is alert and oriented to person, place, and time.  Fluent speech  Skin: Skin is warm and dry.  Psychiatric: She has a normal mood and affect. Judgment normal.  Nursing note and vitals reviewed.    ED Treatments / Results  Labs (all labs ordered are listed, but only abnormal results are displayed) Labs Reviewed  RAPID STREP SCREEN ([facility] & [facility] ONLY)    EKG None  Radiology No results found.  Procedures Procedures (including critical care time)  Medications Ordered in ED Medications - No data to display   Initial Impression / Assessment and Plan / ED Course  I have reviewed the triage vital signs and the nursing notes.     Well-appearing on exam, normal vital signs, oropharynx clear with no erythema or exudates.  Tonsils surgically absent.  Given presence of cough and normal-appearing throat, strep throat is extremely unlikely.  Suspect viral URI.  Discussed supportive measures including over-the-counter medications and reviewed return precautions.  Final Clinical Impressions(s) / ED Diagnoses   Final diagnoses:  Viral URI with cough    ED Discharge Orders    None       Vicenta Olds, Wenda Overland, MD 04/01/18 [id number]

## 2018-04-03 LAB — CULTURE, GROUP A STREP (THRC)

## 2018-06-19 ENCOUNTER — Ambulatory Visit: Payer: Self-pay | Admitting: Medical

## 2018-06-19 DIAGNOSIS — Z0289 Encounter for other administrative examinations: Secondary | ICD-10-CM

## 2018-06-20 ENCOUNTER — Ambulatory Visit: Payer: Self-pay | Admitting: Medical

## 2018-06-20 DIAGNOSIS — Z0289 Encounter for other administrative examinations: Secondary | ICD-10-CM

## 2018-06-25 ENCOUNTER — Ambulatory Visit: Payer: Self-pay | Admitting: Medical

## 2018-06-25 DIAGNOSIS — Z0289 Encounter for other administrative examinations: Secondary | ICD-10-CM

## 2018-06-26 ENCOUNTER — Encounter: Payer: Self-pay | Admitting: Family Medicine
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
## 2018-07-25 ENCOUNTER — Telehealth: Payer: Self-pay | Admitting: Family Medicine

## 2018-07-25 ENCOUNTER — Encounter: Payer: Self-pay | Admitting: Family Medicine

## 2018-07-25 NOTE — Telephone Encounter (Signed)
Copied from [place] [id number]. Topic: Quick Communication - See Telephone Encounter >> Jul 25, 2018 10:42 AM Rosalin Hawking wrote: CRM for notification. See Telephone encounter for: 07/25/18.   Pt dropped off document to be filled out by provider ( 2 pages Medical Contraindication form- Influenza Vaccination) Pt would like to have document faxed and a copy for pt to pick up (pt tel [phone]) Document put at front office tray under providers name.

## 2018-07-26 NOTE — Telephone Encounter (Signed)
Completed as much as possible; forwarded to provider/SLS 11/14

## 2018-07-26 NOTE — Telephone Encounter (Signed)
Patient called to say that she is aware of the turn around time on paper work but is in need of this form so that she can return to her job. She is requesting this form back today if possible please. Ph# [phone]

## 2018-07-27 NOTE — Telephone Encounter (Signed)
Pt will be by on Monday to p/u the form

## 2018-07-27 NOTE — Telephone Encounter (Signed)
Pt called back in to follow up on paperwork. Advised that PCP is out of the office on Fridays and that she will have completed as soon as possible. Pt expressed understanding.

## 2018-11-28 ENCOUNTER — Emergency Department (HOSPITAL_BASED_OUTPATIENT_CLINIC_OR_DEPARTMENT_OTHER)
Admission: EM | Admit: 2018-11-28 | Discharge: 2018-11-28 | Disposition: A | Discharge status: Home / Self Care | Payer: BLUE CROSS/BLUE SHIELD | Attending: Emergency Medicine | Admitting: Emergency Medicine

## 2018-11-28 ENCOUNTER — Other Ambulatory Visit: Payer: Self-pay

## 2018-11-28 ENCOUNTER — Emergency Department (HOSPITAL_BASED_OUTPATIENT_CLINIC_OR_DEPARTMENT_OTHER): Payer: BLUE CROSS/BLUE SHIELD

## 2018-11-28 ENCOUNTER — Encounter (HOSPITAL_BASED_OUTPATIENT_CLINIC_OR_DEPARTMENT_OTHER): Payer: Self-pay | Admitting: Emergency Medicine

## 2018-11-28 DIAGNOSIS — J40 Bronchitis, not specified as acute or chronic: Secondary | ICD-10-CM | POA: Diagnosis not present

## 2018-11-28 DIAGNOSIS — R0602 Shortness of breath: Secondary | ICD-10-CM | POA: Diagnosis present

## 2018-11-28 MED ORDER — PREDNISONE 10 MG PO TABS: 40.0000 mg | ORAL_TABLET | Freq: Every day | ORAL | 0 refills | Status: AC

## 2018-11-28 MED ORDER — DEXAMETHASONE 6 MG PO TABS
10.0000 mg | ORAL_TABLET | Freq: Once | ORAL | Status: AC
  Administered 2018-11-28: 10 mg via ORAL
  Filled 2018-11-28: qty 1

## 2018-11-28 MED ORDER — ALBUTEROL SULFATE HFA 108 (90 BASE) MCG/ACT IN AERS
2.0000 | INHALATION_SPRAY | Freq: Once | RESPIRATORY_TRACT | Status: AC
  Administered 2018-11-28: 2 via RESPIRATORY_TRACT
  Filled 2018-11-28: qty 6.7

## 2018-11-28 MED FILL — predniSONE 10 MG TABS: 10 | 4 days supply | Qty: 16 | Fill #0

## 2018-11-28 NOTE — ED Notes (Addendum)
Pt's family member returned to ED to get work note for patient. Provided after verifying visit today.  Ian Bushman RN 10:28 PM 11/28/2018

## 2018-11-28 NOTE — ED Triage Notes (Signed)
Pt states cough, shortness of breath for past 4 days, denies fever at home.  Theraflu taken without much improvement.

## 2018-11-28 NOTE — ED Provider Notes (Signed)
[facility] EMERGENCY DEPARTMENT Provider Note   CSN: [id number] Arrival date & time: 11/28/18  1117    History   Chief Complaint Chief Complaint  Patient presents with  . Cough  . Shortness of Breath    HPI Stefanie Hale is a 38 y.o. female.     HPI   Congestion, cough for 4 days.  Some wheezing.  No known fevers. Generalized weakness. No body aches.  Works at [employer] and has seen a lot of people over the last few days.    Asthma history, has not had recent had breathing treatments.   Past Medical History:  Diagnosis Date  . Anemia   . Childhood asthma    no inhaler,  no problems as adult  . SVD (spontaneous vaginal delivery)    x 2    Patient Active Problem List   Diagnosis Date Noted  . Fibroids 01/02/2018  . Medial tibial stress syndrome, right, initial encounter 10/25/2016  . Sinusitis 07/25/2013  . Irregular menses 07/01/2013  . Unspecified contraceptive management 07/01/2013  . Back pain 01/01/2012  . Rash 12/28/2011  . Vaginal discharge 12/28/2011  . Anemia 12/28/2011  . Cervical cancer screening 12/28/2011  . Annual physical exam 11/17/2011  . SOMNOLENCE 11/24/2010  . EPISTAXIS, RECURRENT 11/24/2010    Past Surgical History:  Procedure Laterality Date  . HYSTEROSCOPY W/D&C Bilateral 01/02/2018   Procedure: DILATATION AND CURETTAGE /HYSTEROSCOPY MYOSURE MYOMECTOMY AND NOVASURE ENDOMETRIAL ABLATION;  Surgeon: Lavonia Drafts, MD;  Location: [facility];  Service: Gynecology;  Laterality: Bilateral;  . TONSILLECTOMY       OB History    Gravida  2   Para  2   Term  2   Preterm      AB      Living  2     SAB      TAB      Ectopic      Multiple      Live Births               Home Medications    Prior to Admission medications   Medication Sig Start Date End Date Taking? Authorizing Provider  ibuprofen (ADVIL,MOTRIN) 600 MG tablet Take 1 tablet (600 mg total) by mouth every 6 (six) hours as needed.  01/15/18   Lavonia Drafts, MD  predniSONE (DELTASONE) 10 MG tablet Take 4 tablets (40 mg total) by mouth daily for 4 days. 11/28/18 12/02/18  Gareth Morgan, MD  Vitamin D, Ergocalciferol, (DRISDOL) 50000 units CAPS capsule TAKE 1 CAPSULE BY MOUTH EVERY 7 DAYS Patient not taking: Reported on 02/07/2018 01/08/18   Lyndal Pulley, DO    Family History Family History  Problem Relation Age of Onset  . Arthritis Other   . Hypertension Other   . Stroke Other     Social History Social History   Tobacco Use  . Smoking status: Never Smoker  . Smokeless tobacco: Never Used  Substance Use Topics  . Alcohol use: Never    Frequency: Never  . Drug use: No     Allergies   Influenza vaccines and Penicillins   Review of Systems Review of Systems  Constitutional: Positive for fatigue. Negative for fever.  HENT: Positive for congestion. Negative for sore throat.   Eyes: Negative for visual disturbance.  Respiratory: Positive for cough, shortness of breath and wheezing.   Cardiovascular: Negative for chest pain.  Gastrointestinal: Negative for abdominal pain, nausea and vomiting.  Genitourinary: Negative for difficulty urinating.  Musculoskeletal: Negative for back pain and neck pain.  Skin: Negative for rash.  Neurological: Negative for syncope and headaches.     Physical Exam Updated Vital Signs BP 122/78 (BP Location: Right Arm)   Pulse 78   Temp 98.3 F (36.8 C) (Oral)   Resp 18   Ht 5\' 7"  (1.702 m)   Wt 102.1 kg   SpO2 98%   BMI 35.24 kg/m   Physical Exam Vitals signs and nursing note reviewed.  Constitutional:      General: She is not in acute distress.    Appearance: She is well-developed. She is ill-appearing. She is not diaphoretic.  HENT:     Head: Normocephalic and atraumatic.  Eyes:     Conjunctiva/sclera: Conjunctivae normal.  Neck:     Musculoskeletal: Normal range of motion.  Cardiovascular:     Rate and Rhythm: Normal rate and regular rhythm.      Heart sounds: Normal heart sounds. No murmur. No friction rub. No gallop.   Pulmonary:     Effort: Pulmonary effort is normal. No respiratory distress.     Breath sounds: Wheezing present. No rales.  Abdominal:     General: There is no distension.     Palpations: Abdomen is soft.     Tenderness: There is no abdominal tenderness. There is no guarding.  Musculoskeletal:        General: No tenderness.  Skin:    General: Skin is warm and dry.     Findings: No erythema or rash.  Neurological:     Mental Status: She is alert and oriented to person, place, and time.      ED Treatments / Results  Labs (all labs ordered are listed, but only abnormal results are displayed) Labs Reviewed - No data to display  EKG None  Radiology Dg Chest 2 View  Result Date: 11/28/2018 CLINICAL DATA:  Cough and congestion for several days EXAM: CHEST - 2 VIEW COMPARISON:  08/04/2013 FINDINGS: The heart size and mediastinal contours are within normal limits. Both lungs are clear. The visualized skeletal structures are unremarkable. IMPRESSION: No active cardiopulmonary disease. Electronically Signed   By: Inez Catalina M.D.   On: 11/28/2018 12:18    Procedures Procedures (including critical care time)  Medications Ordered in ED Medications  dexamethasone (DECADRON) tablet 10 mg (10 mg Oral Given 11/28/18 1343)  albuterol (PROVENTIL HFA;VENTOLIN HFA) 108 (90 Base) MCG/ACT inhaler 2 puff (2 puffs Inhalation Given 11/28/18 1343)     Initial Impression / Assessment and Plan / ED Course  I have reviewed the triage vital signs and the nursing notes.  Pertinent labs & imaging results that were available during my care of the patient were reviewed by me and considered in my medical decision making (see chart for details).        38yo female with history of childhood asthma, anemia, presents with concern for cough, congestion and dyspnea.  CXR without abnormalities.  No fever, no COVID contacts, does  not meet testing criteria at this time.  History and exam consistent with bronchitis with wheezing present. Given decadron and albuterol in ED and rx for prednisone. Patient discharged in stable condition with understanding of reasons to return.   Final Clinical Impressions(s) / ED Diagnoses   Final diagnoses:  Bronchitis    ED Discharge Orders         Ordered    predniSONE (DELTASONE) 10 MG tablet  Daily     11/28/18 1336  Gareth Morgan, MD 11/28/18 2146

## 2019-03-22 ENCOUNTER — Telehealth: Payer: Self-pay

## 2019-03-22 NOTE — Telephone Encounter (Signed)
Patient is requesting motrin 600mg . Patient has not been seen since 01/2018 she will need an appointment.

## 2019-03-24 ENCOUNTER — Encounter: Payer: Self-pay | Admitting: Family Medicine

## 2019-06-12 ENCOUNTER — Other Ambulatory Visit: Payer: Self-pay

## 2019-06-12 ENCOUNTER — Encounter: Payer: Self-pay | Admitting: Family Medicine

## 2019-06-12 ENCOUNTER — Ambulatory Visit (INDEPENDENT_AMBULATORY_CARE_PROVIDER_SITE_OTHER): Payer: BC Managed Care – PPO | Admitting: Family Medicine

## 2019-06-12 ENCOUNTER — Ambulatory Visit (HOSPITAL_BASED_OUTPATIENT_CLINIC_OR_DEPARTMENT_OTHER)
Admission: RE | Admit: 2019-06-12 | Discharge: 2019-06-12 | Disposition: A | Discharge status: Home / Self Care | Payer: BC Managed Care – PPO | Source: Ambulatory Visit | Attending: Family Medicine | Admitting: Family Medicine

## 2019-06-12 VITALS — BP 120/80 | HR 72 | Temp 97.8°F | Ht 67.0 in | Wt 226.0 lb

## 2019-06-12 DIAGNOSIS — M25561 Pain in right knee: Secondary | ICD-10-CM | POA: Insufficient documentation

## 2019-06-12 DIAGNOSIS — Z0279 Encounter for issue of other medical certificate: Secondary | ICD-10-CM

## 2019-06-12 MED ORDER — OXYCODONE HCL 5 MG PO TABS: 5.0000 mg | ORAL_TABLET | Freq: Four times a day (QID) | ORAL | 0 refills | Status: DC | PRN

## 2019-06-12 MED ORDER — MELOXICAM 15 MG PO TABS: 15.0000 mg | ORAL_TABLET | Freq: Every day | ORAL | 0 refills | Status: DC

## 2019-06-12 NOTE — Patient Instructions (Addendum)
Ice/cold pack over area for 10-15 min twice daily.  OK to take Tylenol 1000 mg (2 extra strength tabs) or 975 mg (3 regular strength tabs) every 6 hours as needed.  Do not drink alcohol, do any illicit/street drugs, drive or do anything that requires alertness while on this medicine (oxycodone).   Use the crutches as long as you need them.   We will be in touch regarding your X-ray results.   Let us know if you need anything.

## 2019-06-12 NOTE — Progress Notes (Signed)
Musculoskeletal Exam  Patient: Stefanie Hale DOB: 1981-02-18  DOS: 06/12/2019  SUBJECTIVE:  Chief Complaint:   Chief Complaint  Patient presents with  . Leg Injury    right leg fell off  a ladder at work    Stefanie Hale is a 38 y.o.  female for evaluation and treatment of R leg pain.   Onset:  3 days ago. [place] off a ladder at work, landed on R leg/knee Location: ant R knee Character:  sharp  Progression of issue:  is unchanged Associated symptoms: bruising, swelling, pain w ROM that is slightly decreased Treatment: to date has been OTC NSAIDS and acetaminophen.   Neurovascular symptoms: no  ROS: Musculoskeletal/Extremities: +R leg pain  Past Medical History:  Diagnosis Date  . Anemia   . Childhood asthma    no inhaler,  no problems as adult  . SVD (spontaneous vaginal delivery)    x 2    Objective: VITAL SIGNS: BP 120/80 (BP Location: Left Arm, Patient Position: Sitting, Cuff Size: Large)   Pulse 72   Temp 97.8 F (36.6 C) (Temporal)   Ht 5\' 7"  (1.702 m)   Wt 226 lb (102.5 kg)   SpO2 96%   BMI 35.40 kg/m  Constitutional: Well formed, well developed. No acute distress. Cardiovascular: Brisk cap refill Thorax & Lungs: No accessory muscle use Musculoskeletal: R knee.   Normal active range of motion: yes.   Normal passive range of motion: yes Tenderness to palpation: Yes; over prox tib and patellar, no jt line ttp Deformity: no Ecchymosis: no Tests positive: None Tests negative: Lachman's, posterior drawer, varus/valgus, McMurray's, Stine's Neurologic: Antalgic gait Psychiatric: Normal mood. Age appropriate judgment and insight. Alert & oriented x 3.    Assessment:  Acute pain of right knee - Plan: DG Knee Complete 4 Views Right, meloxicam (MOBIC) 15 MG tablet, oxyCODONE (OXY IR/ROXICODONE) 5 MG immediate release tablet  Plan: Mobic daily, Tylenol as needed, oxycodone as needed.  Warnings about oxycodone and alertness provided.  Crutches  given today.  I am concerned about a proximal tibial fracture but will await the official read. F/u prn. The patient voiced understanding and agreement to the plan.   [place], DO 06/12/19  2:28 PM

## 2019-06-17 NOTE — Progress Notes (Signed)
[name] [address] [place], [place] [address] Phone: [phone] Subjective:    I'm seeing this patient by the request  of:  Copland, Gay Filler, MD  Lorenza Evangelist  CC:   [id number]    I, Wendy Poet, LAT, ATC am serving as scribe for Dr. Hulan Saas.  12/27/16: I believe the patient is healed at this time. May increase activity as tolerated. Change into regular shoes. No significant restrictions. Compression sleeve given today that I think will help with some of the discomfort during her workday. Discuss the need for potential custom orthotics. Patient will follow-up me more on an as needed basis.  Update- 06/18/19: Stefanie Hale is a 38 y.o. female coming in with complaint of right knee pain. Last seen in 2018 for MTSS. Patient states that she slipped and fell off a ladder at work on 06/07/19.  Pt states that she landed on the front of her R knee.  Pt saw Dr. Nani Ravens on 06/12/19 and he gave her crutches and prescribed meloxicam and oxycodone.  Pt states that her knee continues to hurt and has the most difficulty w/ walking and knee flexion.  Pt rates her pain at an 8/10 at the worst and a 6/10 throbbing pain currently.  Pt states that she feels like her R knee is swollen.  Pt reports a "pop" in the R knee yesterday when trying to bend her knee.     most recent xray  1. No fracture or subluxation of the right knee. 2. Early degenerative tricompartmental spurring. 3. 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   Past Medical History:  Diagnosis Date  . Anemia   . Childhood asthma    no inhaler,  no problems as adult  . SVD (spontaneous vaginal delivery)    x 2   Past Surgical History:  Procedure Laterality Date  . HYSTEROSCOPY W/D&C Bilateral 01/02/2018   Procedure: DILATATION AND CURETTAGE /HYSTEROSCOPY MYOSURE MYOMECTOMY AND  NOVASURE ENDOMETRIAL ABLATION;  Surgeon: Lavonia Drafts, MD;  Location: [facility];  Service: Gynecology;  Laterality: Bilateral;  . TONSILLECTOMY     Social History   Socioeconomic History  . Marital status: Single    Spouse name: Not on file  . Number of children: Not on file  . Years of education: Not on file  . Highest education level: Not on file  Occupational History  . Not on file  Social Needs  . Financial resource strain: Not on file  . Food insecurity    Worry: Not on file    Inability: Not on file  . Transportation needs    Medical: Not on file    Non-medical: Not on file  Tobacco Use  . Smoking status: Never Smoker  . Smokeless tobacco: Never Used  Substance and Sexual Activity  . Alcohol use: Never    Frequency: Never  . Drug use: No  . Sexual activity: Yes    Birth control/protection: None  Lifestyle  . Physical activity    Days per week: Not on file    Minutes per session: Not on file  . Stress: Not on file  Relationships  . Social Herbalist on phone: Not on file    Gets together: Not on file    Attends religious service: Not on file    Active member of club or organization: Not on file  Attends meetings of clubs or organizations: Not on file    Relationship status: Not on file  Other Topics Concern  . Not on file  Social History Narrative   Occupation: [employer])   Single   living with mom   1 son  4   1 daughter 6   mother is ([name])Smoking Status:  never   Caffeine use/day:  1 beverage daily   Does Patient Exercise:  no         Allergies  Allergen Reactions  . Influenza Vaccines Other (See Comments)    Arm swelling and felt very bad.  . Penicillins     REACTION: hives   Family History  Problem Relation Age of Onset  . Arthritis Other   . Hypertension Other   . Stroke Other        Current Outpatient Medications (Analgesics):  .  meloxicam (MOBIC) 15 MG tablet, Take 1  tablet (15 mg total) by mouth daily. Marland Kitchen  oxyCODONE (OXY IR/ROXICODONE) 5 MG immediate release tablet, Take 1 tablet (5 mg total) by mouth every 6 (six) hours as needed for severe pain. Marland Kitchen  ibuprofen (ADVIL,MOTRIN) 600 MG tablet, Take 1 tablet (600 mg total) by mouth every 6 (six) hours as needed. (Patient not taking: Reported on 06/18/2019)      Past medical history, social, surgical and family history all reviewed in electronic medical record.  No pertanent information unless stated regarding to the chief complaint.   Review of Systems:  No headache, visual changes, nausea, vomiting, diarrhea, constipation, dizziness, abdominal pain, skin rash, fevers, chills, night sweats, weight loss, swollen lymph nodes, body aches,chest pain, shortness of breath, mood changes.  Positive muscle aches and joint swelling  Objective  Blood pressure 120/84, pulse 84, height 5\' 7"  (1.702 m), weight 236 lb (107 kg), SpO2 96 %.   General: No apparent distress alert and oriented x3 mood and affect normal, dressed appropriately.  HEENT: Pupils equal, extraocular movements intact  Respiratory: Patient's speak in full sentences and does not appear short of breath  Cardiovascular: No lower extremity edema, non tender, no erythema  Skin: Warm dry intact with no signs of infection or rash on extremities or on axial skeleton.  Abdomen: Soft nontender  Neuro: Cranial nerves II through XII are intact, neurovascularly intact in all extremities with 2+ DTRs and 2+ pulses.  Lymph: No lymphadenopathy of posterior or anterior cervical chain or axillae bilaterally.  Gait severely antalgic MSK:  tender with full range of motion and good stability and symmetric strength and tone of shoulders, elbows, wrist, hip, and ankles bilaterally.  Right knee exam shows the patient does have an effusion.  Lacks the last 25 degrees of flexion.  Lacks the last 2 degrees of extension.  Severely tender to palpation over the tibia proximally.   Some over the tibial tuberosity that is quite severe.  Mild swelling in this area as well.  No effusion noted.  Patient has voluntary guarding still unable to check stability of the knee significantly.  Limited musculoskeletal ultrasound was performed and interpreted by Lyndal Pulley  Limited ultrasound of patient's knee shows the patient does have a cortical irregularity of the insertion of the patella tendon on the tibial tuberosity.  Looks to be a very small fracture noted but no avulsion truly noted.  In addition to this patient does have a moderate effusion of the knee noted.   Impression and Recommendations:  This case required medical decision making of moderate complexity. The above documentation has been reviewed and is accurate and complete Lyndal Pulley, DO       Note: This dictation was prepared with Dragon dictation along with smaller phrase technology. Any transcriptional errors that result from this process are unintentional.

## 2019-06-18 ENCOUNTER — Encounter: Payer: Self-pay | Admitting: Family Medicine

## 2019-06-18 ENCOUNTER — Other Ambulatory Visit: Payer: Self-pay

## 2019-06-18 ENCOUNTER — Ambulatory Visit (INDEPENDENT_AMBULATORY_CARE_PROVIDER_SITE_OTHER): Payer: BC Managed Care – PPO | Admitting: Family Medicine

## 2019-06-18 ENCOUNTER — Ambulatory Visit: Payer: Self-pay

## 2019-06-18 VITALS — BP 120/84 | HR 84 | Ht 67.0 in | Wt 236.0 lb

## 2019-06-18 DIAGNOSIS — M25561 Pain in right knee: Secondary | ICD-10-CM

## 2019-06-18 DIAGNOSIS — S8991XA Unspecified injury of right lower leg, initial encounter: Secondary | ICD-10-CM | POA: Diagnosis not present

## 2019-06-18 NOTE — Patient Instructions (Signed)
Knee immobilizer during the day only.
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
## 2019-06-21 ENCOUNTER — Encounter: Payer: Self-pay | Admitting: Family Medicine

## 2019-06-27 ENCOUNTER — Other Ambulatory Visit: Payer: Self-pay

## 2019-06-27 DIAGNOSIS — M25561 Pain in right knee: Secondary | ICD-10-CM

## 2019-06-27 DIAGNOSIS — G8929 Other chronic pain: Secondary | ICD-10-CM

## 2019-07-03 ENCOUNTER — Telehealth: Payer: Self-pay

## 2019-07-03 NOTE — Telephone Encounter (Signed)
Copied from [place] [id number]. Topic: General - Other >> Jul 02, 2019  5:12 PM Mcneil, Ja-Kwan wrote: Reason for CRM: Pt called in to see if any paperwork was sent in regarding a fall she had. Pt requests call back.

## 2019-07-03 NOTE — Telephone Encounter (Signed)
Not per my knowledge.  Please let her know

## 2019-07-04 NOTE — Telephone Encounter (Signed)
Sent patient mychart message

## 2019-07-07 ENCOUNTER — Other Ambulatory Visit: Payer: Self-pay

## 2019-07-07 ENCOUNTER — Ambulatory Visit
Admission: RE | Admit: 2019-07-07 | Discharge: 2019-07-07 | Disposition: A | Discharge status: Home / Self Care | Payer: BC Managed Care – PPO | Source: Ambulatory Visit | Attending: Family Medicine | Admitting: Family Medicine

## 2019-07-07 DIAGNOSIS — G8929 Other chronic pain: Secondary | ICD-10-CM

## 2019-07-24 ENCOUNTER — Other Ambulatory Visit: Payer: Self-pay

## 2019-07-24 ENCOUNTER — Encounter (HOSPITAL_BASED_OUTPATIENT_CLINIC_OR_DEPARTMENT_OTHER): Payer: Self-pay | Admitting: *Deleted

## 2019-07-24 ENCOUNTER — Emergency Department (HOSPITAL_BASED_OUTPATIENT_CLINIC_OR_DEPARTMENT_OTHER)
Admission: EM | Admit: 2019-07-24 | Discharge: 2019-07-24 | Disposition: A | Discharge status: Home / Self Care | Payer: BC Managed Care – PPO | Attending: Emergency Medicine | Admitting: Emergency Medicine

## 2019-07-24 DIAGNOSIS — R197 Diarrhea, unspecified: Secondary | ICD-10-CM | POA: Diagnosis not present

## 2019-07-24 DIAGNOSIS — R05 Cough: Secondary | ICD-10-CM

## 2019-07-24 DIAGNOSIS — R059 Cough, unspecified: Secondary | ICD-10-CM

## 2019-07-24 DIAGNOSIS — R438 Other disturbances of smell and taste: Secondary | ICD-10-CM | POA: Insufficient documentation

## 2019-07-24 DIAGNOSIS — Z79899 Other long term (current) drug therapy: Secondary | ICD-10-CM | POA: Insufficient documentation

## 2019-07-24 DIAGNOSIS — Z20822 Contact with and (suspected) exposure to covid-19: Secondary | ICD-10-CM

## 2019-07-24 DIAGNOSIS — R519 Headache, unspecified: Secondary | ICD-10-CM | POA: Diagnosis not present

## 2019-07-24 DIAGNOSIS — U071 COVID-19: Secondary | ICD-10-CM | POA: Insufficient documentation

## 2019-07-24 NOTE — ED Provider Notes (Signed)
[facility] EMERGENCY DEPARTMENT Provider Note   CSN: [id number] Arrival date & time: 07/24/19  1631     History   Chief Complaint Chief Complaint  Patient presents with  . Cough    HPI Stefanie Hale is a 38 y.o. female presents to the ER for evaluation of Covid exposure.  Reports 3 days of dry cough, nausea, headaches, diarrhea, loss of taste.  Her boyfriend was just diagnosed with COVID-19 today.  No interventions.  No alleviating factors patient denies any fever, congestion, sore throat, vomiting, abdominal pain.  No chest pain or shortness of breath.     HPI  Past Medical History:  Diagnosis Date  . Anemia   . Childhood asthma    no inhaler,  no problems as adult  . SVD (spontaneous vaginal delivery)    x 2    Patient Active Problem List   Diagnosis Date Noted  . Right knee injury, initial encounter 06/18/2019  . Fibroids 01/02/2018  . Medial tibial stress syndrome, right, initial encounter 10/25/2016  . Sinusitis 07/25/2013  . Irregular menses 07/01/2013  . Unspecified contraceptive management 07/01/2013  . Back pain 01/01/2012  . Rash 12/28/2011  . Vaginal discharge 12/28/2011  . Anemia 12/28/2011  . Cervical cancer screening 12/28/2011  . Annual physical exam 11/17/2011  . SOMNOLENCE 11/24/2010  . EPISTAXIS, RECURRENT 11/24/2010    Past Surgical History:  Procedure Laterality Date  . HYSTEROSCOPY W/D&C Bilateral 01/02/2018   Procedure: DILATATION AND CURETTAGE /HYSTEROSCOPY MYOSURE MYOMECTOMY AND NOVASURE ENDOMETRIAL ABLATION;  Surgeon: Lavonia Drafts, MD;  Location: [facility];  Service: Gynecology;  Laterality: Bilateral;  . TONSILLECTOMY       OB History    Gravida  2   Para  2   Term  2   Preterm      AB      Living  2     SAB      TAB      Ectopic      Multiple      Live Births               Home Medications    Prior to Admission medications   Medication Sig Start Date End Date Taking?  Authorizing Provider  ibuprofen (ADVIL,MOTRIN) 600 MG tablet Take 1 tablet (600 mg total) by mouth every 6 (six) hours as needed. Patient not taking: Reported on 06/18/2019 01/15/18   Lavonia Drafts, MD  meloxicam (MOBIC) 15 MG tablet Take 1 tablet (15 mg total) by mouth daily. 06/12/19   Shelda Pal, DO  oxyCODONE (OXY IR/ROXICODONE) 5 MG immediate release tablet Take 1 tablet (5 mg total) by mouth every 6 (six) hours as needed for severe pain. 06/12/19   Shelda Pal, DO    Family History Family History  Problem Relation Age of Onset  . Arthritis Other   . Hypertension Other   . Stroke Other     Social History Social History   Tobacco Use  . Smoking status: Never Smoker  . Smokeless tobacco: Never Used  Substance Use Topics  . Alcohol use: Never    Frequency: Never  . Drug use: No     Allergies   Influenza vaccines and Penicillins   Review of Systems Review of Systems  HENT:       Loss of taste  Respiratory: Positive for cough.   Gastrointestinal: Positive for diarrhea and nausea.  Neurological: Positive for headaches.  All other systems reviewed and are negative.  Physical Exam Updated Vital Signs BP 120/78 (BP Location: Right Arm)   Pulse 80   Temp 98.5 F (36.9 C)   Resp 18   Ht 5\' 7"  (1.702 m)   Wt 111.6 kg   SpO2 99%   BMI 38.53 kg/m   Physical Exam Vitals signs and nursing note reviewed.  Constitutional:      Appearance: She is well-developed.     Comments: Non toxic in NAD  HENT:     Head: Normocephalic and atraumatic.     Nose: Nose normal.  Eyes:     Conjunctiva/sclera: Conjunctivae normal.  Neck:     Musculoskeletal: Normal range of motion.  Cardiovascular:     Rate and Rhythm: Normal rate and regular rhythm.  Pulmonary:     Effort: Pulmonary effort is normal.     Breath sounds: Normal breath sounds.  Abdominal:     General: Bowel sounds are normal.     Palpations: Abdomen is soft.     Tenderness:  There is no abdominal tenderness.     Comments: No G/R/R. No suprapubic or CVA tenderness. Negative Murphy's and McBurney's. Active BS to lower quadrants.   Musculoskeletal: Normal range of motion.  Skin:    General: Skin is warm and dry.     Capillary Refill: Capillary refill takes less than 2 seconds.  Neurological:     Mental Status: She is alert.  Psychiatric:        Behavior: Behavior normal.      ED Treatments / Results  Labs (all labs ordered are listed, but only abnormal results are displayed) Labs Reviewed  NOVEL CORONAVIRUS, NAA (HOSP ORDER, SEND-OUT TO REF LAB; TAT 18-24 HRS)    EKG None  Radiology No results found.  Procedures Procedures (including critical care time)  Medications Ordered in ED Medications - No data to display   Initial Impression / Assessment and Plan / ED Course  I have reviewed the triage vital signs and the nursing notes.  Pertinent labs & imaging results that were available during my care of the patient were reviewed by me and considered in my medical decision making (see chart for details).  Clinical Course as of Jul 23 2017  Wed Jul 24, 2019  1820 Cough Saturday, nausea, diarrhea, sore throat boytfroeiudn +   [CG]    Clinical Course User Index [CG] Kinnie Feil, PA-C   I have reviewed patient's EMR to obtain pertinent PMH to assist in MDM.  Symptoms and exam most suggestive of uncomplicated viral illness.   DDX incluldes viral URI including COVID-19 vs influenza vs other.  Doubt strep, bacterial bronchitis or pneumonia unlikely given overall clinical picture. No travel but known exposures to confirmed COVID-19.    Normal WOB. Normal VS.  No tachypnea, hypoxemia. Ambulatory without hypoxia.  Lungs are CTAB.   I do not think that a CXR or emergent work up is indicated at this time given overall clinical well being, normal V.  No signs of consolidation on auscultation and there is no hypoxia, increased WOB or other  concerning features to exam. No clinical signs of severe illness, dehydration to warrant labs or IVF. No significant h/o immunocompromise, comorbidities, lung or heart disease to warrant more work up.   ill discharge with symptomatic treatment. Pt was evaluated in the context of the global COVID-19 pandemic.  We discussed patient's overall low risk profile to develop complications.  Pt aware to follow up on results via Mychart.  Recommended PCP f/u in  the next 2-3 days for re-check to ensure to clinical decline, further guidance as needed. Self-isolation instructions at home until pending test discussed. Pt was given home self-isolation instructions and instructions for family members. Educated on signs and symptoms that would warrant return to ED.  Pt comfortable and agreeable with POC.  Final Clinical Impressions(s) / ED Diagnoses   Final diagnoses:  Cough  Suspected COVID-19 virus infection    ED Discharge Orders    None       Kinnie Feil, PA-C 07/24/19 2018    Lennice Sites, DO 07/24/19 2307

## 2019-07-24 NOTE — ED Triage Notes (Addendum)
Pt  Cough and dirrhhea x 3 days  improved today,  Lost of taste today, boyfriend with positive covid today

## 2019-07-24 NOTE — Discharge Instructions (Signed)
You were seen in the ED for symptoms concerning for COVID.   I suspect you have a virus.  This could be influenza, COVID, other virus.    We tested your for COVID-19 (coronavirus) infection.  It is also possible you could have other viral upper respiratory infection from another virus.    COVID test results come back in 48-72 hours, sometimes sooner.  Someone will call you to notify you of results.  You can also check MyChart for formal results that will be posted.   Treatment of your illness and symptoms for now will include self-isolation, monitoring of symptoms and supportive care with over-the-counter medicines.    Stay well-hydrated. Rest. You can use over the counter medications to help with symptoms: [phone] mg acetaminophen (tylenol) every 6 hours, around the clock to help with associated fevers, sore throat, headaches, generalized body aches and malaise.  Oxymetazoline (afrin) intranasal spray once daily for no more than 3 days to help with congestion, after 3 days you can switch to another over-the-counter nasal steroid spray such as fluticasone (flonase) Allergy medication (loratadine, cetirizine, etc) and phenylephrine (sudafed) help with nasal congestion, runny nose and postnasal drip.   Dextromethorphan (Delsym) to suppress dry cough. Frequent coughing is likely causing your chest wall pain Guaifenesin (Mucinex) to help expectorate mucus and cough Wash your hands often to prevent spread.  Stay hydrated with plenty of clear fluids Rest   Return to the ED if symptoms are worsening or severe, there is increased work of breathing, chest pain or shortness of breath with exertion or activity, inability to tolerate fluids due to persistent vomiting despite nausea medicines, passing out, light headedness.  If your test results are POSITIVE, the following isolation requirements need to be met to return to work and resume essential activities: At least 14 days since symptom onset  72  hours of absence of fever without antifever medicine (ibuprofen, acetaminophen). A fever is temperature of 100.62F or greater. Improvement of respiratory symptoms  If your test is NEGATIVE, you may return to work and essential activities as long as your symptoms have improved and you do not have a fever for a total of 3 days.  Call your job and notify them that your test result was negative to see if they will allow you to return to work.     Infection Prevention Recommendations for Individuals Confirmed to have, or Being Evaluated for, or have symptoms of 2019 Novel Coronavirus (COVID-19) Infection Who Receive Care at Home  Individuals who are confirmed to have, or are being evaluated for, COVID-19 should follow the prevention steps below until a healthcare provider or local or state health department says they can return to normal activities.  Stay home except to get medical care You should restrict activities outside your home, except for getting medical care. Do not go to work, school, or public areas, and do not use public transportation or taxis.  Call ahead before visiting your doctor Before your medical appointment, call the healthcare provider and tell them that you have, or are being evaluated for, COVID-19 infection. This will help the healthcare providers office take steps to keep other people from getting infected. Ask your healthcare provider to call the local or state health department.  Monitor your symptoms Seek prompt medical attention if your illness is worsening (e.g., difficulty breathing). Before going to your medical appointment, call the healthcare provider and tell them that you have, or are being evaluated for, COVID-19 infection. Ask your healthcare provider  to call the local or state health department.  Wear a facemask You should wear a facemask that covers your nose and mouth when you are in the same room with other people and when you visit a healthcare  provider. People who live with or visit you should also wear a facemask while they are in the same room with you.  Separate yourself from other people in your home As much as possible, you should stay in a different room from other people in your home. Also, you should use a separate bathroom, if available.  Avoid sharing household items You should not share dishes, drinking glasses, cups, eating utensils, towels, bedding, or other items with other people in your home. After using these items, you should wash them thoroughly with soap and water.  Cover your coughs and sneezes Cover your mouth and nose with a tissue when you cough or sneeze, or you can cough or sneeze into your sleeve. Throw used tissues in a lined trash can, and immediately wash your hands with soap and water for at least 20 seconds or use an alcohol-based hand rub.  Wash your [facility] your hands often and thoroughly with soap and water for at least 20 seconds. You can use an alcohol-based hand sanitizer if soap and water are not available and if your hands are not visibly dirty. Avoid touching your eyes, nose, and mouth with unwashed hands.   Prevention Steps for Caregivers and Household Members of Individuals Confirmed to have, or Being Evaluated for, or have symptoms of 2019 Novel Coronavirus (COVID-19) Infection Being Cared for in the Home  If you live with, or provide care at home for, a person confirmed to have, or being evaluated for, COVID-19 infection please follow these guidelines to prevent infection:  Follow healthcare providers instructions Make sure that you understand and can help the patient follow any healthcare provider instructions for all care.  Provide for the patients basic needs You should help the patient with basic needs in the home and provide support for getting groceries, prescriptions, and other personal needs.  Monitor the patients symptoms If they are getting sicker, call his or her  medical provider and tell them that the patient has, or is being evaluated for, COVID-19 infection. This will help the healthcare providers office take steps to keep other people from getting infected. Ask the healthcare provider to call the local or state health department.  Limit the number of people who have contact with the patient If possible, have only one caregiver for the patient. Other household members should stay in another home or place of residence. If this is not possible, they should stay in another room, or be separated from the patient as much as possible. Use a separate bathroom, if available. Restrict visitors who do not have an essential need to be in the home.  Keep older adults, very young children, and other sick people away from the patient Keep older adults, very young children, and those who have compromised immune systems or chronic health conditions away from the patient. This includes people with chronic heart, lung, or kidney conditions, diabetes, and cancer.  Ensure good ventilation Make sure that shared spaces in the home have good air flow, such as from an air conditioner or an opened window, weather permitting.  Wash your hands often Wash your hands often and thoroughly with soap and water for at least 20 seconds. You can use an alcohol based hand sanitizer if soap and water are  not available and if your hands are not visibly dirty. Avoid touching your eyes, nose, and mouth with unwashed hands. Use disposable paper towels to dry your hands. If not available, use dedicated cloth towels and replace them when they become wet.  Wear a facemask and gloves Wear a disposable facemask at all times in the room and gloves when you touch or have contact with the patients blood, body fluids, and/or secretions or excretions, such as sweat, saliva, sputum, nasal mucus, vomit, urine, or feces.  Ensure the mask fits over your nose and mouth tightly, and do not touch it during  use. Throw out disposable facemasks and gloves after using them. Do not reuse. Wash your hands immediately after removing your facemask and gloves. If your personal clothing becomes contaminated, carefully remove clothing and launder. Wash your hands after handling contaminated clothing. Place all used disposable facemasks, gloves, and other waste in a lined container before disposing them with other household waste. Remove gloves and wash your hands immediately after handling these items.  Do not share dishes, glasses, or other household items with the patient Avoid sharing household items. You should not share dishes, drinking glasses, cups, eating utensils, towels, bedding, or other items with a patient who is confirmed to have, or being evaluated for, COVID-19 infection. After the person uses these items, you should wash them thoroughly with soap and water.  Wash laundry thoroughly Immediately remove and wash clothes or bedding that have blood, body fluids, and/or secretions or excretions, such as sweat, saliva, sputum, nasal mucus, vomit, urine, or feces, on them. Wear gloves when handling laundry from the patient. Read and follow directions on labels of laundry or clothing items and detergent. In general, wash and dry with the warmest temperatures recommended on the label.  Clean all areas the individual has used often Clean all touchable surfaces, such as counters, tabletops, doorknobs, bathroom fixtures, toilets, phones, keyboards, tablets, and bedside tables, every day. Also, clean any surfaces that may have blood, body fluids, and/or secretions or excretions on them. Wear gloves when cleaning surfaces the patient has come in contact with. Use a diluted bleach solution (e.g., dilute bleach with 1 part bleach and 10 parts water) or a household disinfectant with a label that says EPA-registered for coronaviruses. To make a bleach solution at home, add 1 tablespoon of bleach to 1 quart (4  cups) of water. For a larger supply, add  cup of bleach to 1 gallon (16 cups) of water. Read labels of cleaning products and follow recommendations provided on product labels. Labels contain instructions for safe and effective use of the cleaning product including precautions you should take when applying the product, such as wearing gloves or eye protection and making sure you have good ventilation during use of the product. Remove gloves and wash hands immediately after cleaning.  Monitor yourself for signs and symptoms of illness Caregivers and household members are considered close contacts, should monitor their health, and will be asked to limit movement outside of the home to the extent possible. Follow the monitoring steps for close contacts listed on the symptom monitoring form.  ? If you have additional questions, contact your local health department or call the epidemiologist on call at [phone] (available 24/7). ? This guidance is subject to change. For the most up-to-date guidance from [facility], please refer to their website: [email or web address]

## 2019-07-28 ENCOUNTER — Telehealth: Payer: Self-pay | Admitting: *Deleted

## 2019-07-28 NOTE — Telephone Encounter (Signed)
Pt notified of positive COVID-19 test results. Pt verbalized understanding. Pt reports symptoms of coughing and states that it is hard to taste.Pt advised to remain in self quarantine until at least 10 days since symptom onset And 3 consecutive days fever free without antipyretics And improvement in respiratory symptoms. Pt advised to notify PCP of positive results and any changes in symptoms.Patient advised to utilize over the counter medications to treat symptoms. Pt advised to seek treatment in the ED if respiratory issues/distress develops.Pt advised they should only leave home to seek and medical care and must wear a mask in public. Pt instructed to limit contact with family members or caregivers in the home. Pt advised to practice social distancing and to continue to use good preventative care measures such has frequent hand washing, staying out of crowds and cleaning hard surfaces frequently touched in the home.Pt informed that the health department will likely follow up and may have additional recommendations. [facility] Department notified.

## 2019-07-30 LAB — NOVEL CORONAVIRUS, NAA (HOSP ORDER, SEND-OUT TO REF LAB; TAT 18-24 HRS): SARS-CoV-2, NAA: DETECTED — AB

## 2019-08-01 ENCOUNTER — Encounter: Payer: Self-pay | Admitting: Family Medicine

## 2019-08-01 MED ORDER — ALBUTEROL SULFATE HFA 108 (90 BASE) MCG/ACT IN AERS: 2.0000 | INHALATION_SPRAY | Freq: Four times a day (QID) | RESPIRATORY_TRACT | 1 refills | Status: DC | PRN

## 2019-08-13 NOTE — Progress Notes (Signed)
[place] [facility] [address] [place], [place] [address] [phone] [id number] [phone]  Date:  08/14/2019   Name:  Stefanie Hale   DOB:  [birth year]   MRN:  [id number]  PCP:  Darreld Mclean, MD    Chief Complaint: No chief complaint on file.   History of Present Illness:  Stefanie Hale is a 38 y.o. very pleasant female patient who presents with the following:  Virtual visit today to follow-up from recent COVID-19 infection Patient location is home, provider location is office Patient identity confirmed with 2 factors, she gives consent for virtual visit today I last saw her in the office in 2018  She had a positive COVID-19 test on November 11 per the emergency department She is now feeling about 80% well I had called her in some albuterol that did seem to help No fevers at this time- she did not have a high fever even when she was ill Her BF had covid as well- he is doing ok, did not have a severe case  She would like to go back to work today or tomorrow 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  Contagion is no longer a concern. Her job can be more physically active recently as they are short staffed and she is not sure if she is physically up to par. she has to do more lifting, carrying, walking than she normally would  Otherwise generally in good health     Patient Active Problem List   Diagnosis Date Noted  . Right knee injury, initial encounter 06/18/2019  . Fibroids 01/02/2018  . Medial tibial stress syndrome, right, initial encounter 10/25/2016  . Sinusitis 07/25/2013  . Irregular menses 07/01/2013  . Unspecified contraceptive management 07/01/2013  . Back pain 01/01/2012  . Rash 12/28/2011  . Vaginal discharge 12/28/2011  . Anemia 12/28/2011  . SOMNOLENCE 11/24/2010  . EPISTAXIS, RECURRENT  11/24/2010    Past Medical History:  Diagnosis Date  . Anemia   . Childhood asthma    no inhaler,  no problems as adult  . SVD (spontaneous vaginal delivery)    x 2    Past Surgical History:  Procedure Laterality Date  . HYSTEROSCOPY W/D&C Bilateral 01/02/2018   Procedure: DILATATION AND CURETTAGE /HYSTEROSCOPY MYOSURE MYOMECTOMY AND NOVASURE ENDOMETRIAL ABLATION;  Surgeon: Lavonia Drafts, MD;  Location: [facility];  Service: Gynecology;  Laterality: Bilateral;  . TONSILLECTOMY      Social History   Tobacco Use  . Smoking status: Never Smoker  . Smokeless tobacco: Never Used  Substance Use Topics  . Alcohol use: Never    Frequency: Never  . Drug use: No    Family History  Problem Relation Age of Onset  . Arthritis Other   . Hypertension Other   . Stroke Other     Allergies  Allergen Reactions  . Influenza Vaccines Other (See Comments)    Arm swelling and felt very bad.  . Penicillins     REACTION: hives    Medication list has been reviewed and updated.  Current Outpatient Medications on File Prior to Visit  Medication Sig Dispense Refill  . albuterol (VENTOLIN HFA) 108 (90 Base) MCG/ACT inhaler Inhale 2 puffs into the lungs every 6 (six) hours as needed for wheezing or shortness of breath. 18 g  1  . ibuprofen (ADVIL,MOTRIN) 600 MG tablet Take 1 tablet (600 mg total) by mouth every 6 (six) hours as needed. (Patient not taking: Reported on 06/18/2019) 20 tablet 0  . meloxicam (MOBIC) 15 MG tablet Take 1 tablet (15 mg total) by mouth daily. 30 tablet 0  . oxyCODONE (OXY IR/ROXICODONE) 5 MG immediate release tablet Take 1 tablet (5 mg total) by mouth every 6 (six) hours as needed for severe pain. 12 tablet 0   No current facility-administered medications on file prior to visit.     Review of Systems:  As per HPI- otherwise negative.   Physical Examination: There were no vitals filed for this visit. There were no vitals filed for this visit. There is no  height or weight on file to calculate BMI. Ideal Body Weight:    She is not checking any BP at home  No fever noted  Patient observed over video monitor.  She looks well, her normal self.  No cough, wheezing, shortness of breath is noted  Assessment and Plan: COVID-19 virus infection  Following up today for virtual visit after recent COVID-19 infection.  [place] tested positive on November 11, which is 21 days ago. She has no fevers, her other symptoms have improved.  She would like to return to work, but notes that she is not physically 100% yet I put a return to work note into her chart.  She is cleared to return to work, but will need to limit her physical exertion for the next 1 to 2 weeks while she continues to recover She will let me know if any other questions or concerns  Signed Lamar Blinks, MD

## 2019-08-14 ENCOUNTER — Other Ambulatory Visit: Payer: Self-pay

## 2019-08-14 ENCOUNTER — Encounter: Payer: Self-pay | Admitting: Family Medicine

## 2019-08-14 ENCOUNTER — Ambulatory Visit (INDEPENDENT_AMBULATORY_CARE_PROVIDER_SITE_OTHER): Payer: BC Managed Care – PPO | Admitting: Family Medicine

## 2019-08-14 ENCOUNTER — Other Ambulatory Visit: Payer: Self-pay | Admitting: Family Medicine

## 2019-08-14 DIAGNOSIS — U071 COVID-19: Secondary | ICD-10-CM | POA: Diagnosis not present

## 2019-08-16 ENCOUNTER — Telehealth: Payer: Self-pay | Admitting: *Deleted

## 2019-08-16 NOTE — Telephone Encounter (Signed)
[name] from [facility] called for patients contact information.

## 2020-01-21 NOTE — Patient Instructions (Addendum)
It was good to see you again today, I will be in touch with your pap and labs asap Please get your covid 19 vaccine at your earliest convenience I will be in touch with Dr Ihor Dow about your positive test today and see if we need to do anything special given recent ablation  Take care!

## 2020-01-21 NOTE — Progress Notes (Addendum)
[place] at [facility] [address], [place], [place] [address] [phone]  Date:  01/23/2020   Name:  Stefanie Hale   DOB:  [birth year]   MRN:  [id number]  PCP:  Darreld Mclean, MD    Chief Complaint: Visit for Pap Smear   History of Present Illness:  Stefanie Hale is a 39 y.o. very pleasant female patient who presents with the following:  Pt here today for a pap smear I last saw her for virtual visit in December to follow-up from COVID-19 diagnosis on July 24, 2019.  At that time she was still getting her strength back She does feel like she recovered fully at this time  Last pap 2017- negative   Can offer routine labs today -she would like to do blood work today covid series - I encouraged her to get this asap  She had an endometrial ablation in 2018 yes.  She has had fairly regular periods since that time, however no bleeding since end of March 2021- she thinks LMP was in the later part of March but is not sure of the date, perhaps 11/30/19 No bleeding since, no abdominal pain  She has noted some breast tenderness, she did feel sick and vomited once last week She has 2 kids ages 67 and 83   Patient Active Problem List   Diagnosis Date Noted  . Right knee injury, initial encounter 06/18/2019  . Fibroids 01/02/2018  . Medial tibial stress syndrome, right, initial encounter 10/25/2016  . Sinusitis 07/25/2013  . Irregular menses 07/01/2013  . Unspecified contraceptive management 07/01/2013  . Back pain 01/01/2012  . Rash 12/28/2011  . Vaginal discharge 12/28/2011  . Anemia 12/28/2011  . SOMNOLENCE 11/24/2010  . EPISTAXIS, RECURRENT 11/24/2010    Past Medical History:  Diagnosis Date  . Anemia   . Childhood asthma    no inhaler,  no problems as adult  . SVD (spontaneous vaginal delivery)    x 2    Past Surgical History:  Procedure Laterality Date  . HYSTEROSCOPY WITH D & C Bilateral  01/02/2018   Procedure: DILATATION AND CURETTAGE /HYSTEROSCOPY MYOSURE MYOMECTOMY AND NOVASURE ENDOMETRIAL ABLATION;  Surgeon: Lavonia Drafts, MD;  Location: [facility];  Service: Gynecology;  Laterality: Bilateral;  . TONSILLECTOMY      Social History   Tobacco Use  . Smoking status: Never Smoker  . Smokeless tobacco: Never Used  Substance Use Topics  . Alcohol use: Never  . Drug use: No    Family History  Problem Relation Age of Onset  . Arthritis Other   . Hypertension Other   . Stroke Other     Allergies  Allergen Reactions  . Influenza Vaccines Other (See Comments)    Arm swelling and felt very bad.  . Penicillins     REACTION: hives    Medication list has been reviewed and updated.  No current outpatient medications on file prior to visit.   No current facility-administered medications on file prior to visit.    Review of Systems:  As per HPI- otherwise negative.   Physical Examination: Vitals:   01/23/20 1340  BP: 98/66  Pulse: 76  Resp: 16  Temp: 97.8 F (36.6 C)  SpO2: 99%   Vitals:   01/23/20 1340  Weight: 229 lb (103.9 kg)  Height: 5\' 7"  (1.702 m)   Body mass index is 35.87 kg/m. Ideal Body Weight: Weight in (lb) to have BMI =  25: 159.3  GEN: no acute distress.  Obese, looks well  HEENT: Atraumatic, Normocephalic.  Ears and Nose: No external deformity. CV: RRR, No M/G/R. No JVD. No thrill. No extra heart sounds. PULM: CTA B, no wheezes, crackles, rhonchi. No retractions. No resp. distress. No accessory muscle use. ABD: S, NT, ND, +BS. No rebound. No HSM. EXTR: No c/c/e PSYCH: Normally interactive. Conversant.  Pap performed today.  Normal vagina, cervix, vulva.   BP Readings from Last 3 Encounters:  01/23/20 98/66  07/24/19 120/78  06/18/19 120/84   Results for orders placed or performed in visit on 01/23/20  POCT urine pregnancy  Result Value Ref Range   Preg Test, Ur Positive (A) Negative     Assessment and  Plan: Screening for cervical cancer - Plan: Cytology - PAP  Amenorrhea - Plan: POCT urine pregnancy  Screening for deficiency anemia - Plan: CBC  Screening for thyroid disorder - Plan: TSH  Screening for hyperlipidemia - Plan: Lipid panel  Screening for diabetes mellitus - Plan: Comprehensive metabolic panel, Hemoglobin A1c  Screening for HIV (human immunodeficiency virus) - Plan: HIV Antibody (routine testing w rflx)  Less than [redacted] weeks gestation of pregnancy - Plan: B-HCG Quant  Patient here today for a Pap, and routine labs. We did an hCG due to amenorrhea, and the patient was quite surprised to find that she is pregnant.  This is unexpected, she is processing this information and will let us know how she wishes to proceed.  I did order a beta quant today, and have touch base with her gynecologist due to history of endometrial ablation Cautioned patient to seek evaluation at the emergency room if she develops any pain or bleeding This visit occurred during the SARS-CoV-2 public health emergency.  Safety protocols were in place, including screening questions prior to the visit, additional usage of staff PPE, and extensive cleaning of exam room while observing appropriate contact time as indicated for disinfecting solutions.  Moderate medical decision making today  Signed Lamar Blinks, MD  Addendum 5/14, received her labs as below-message to patient  HCG, Total, QN mIU/mL 78,310   Comment: Verified by repeat analysis.  .  .  Results verified by repeat analysis on dilution.  .  Gestational Age  Expected hCG values (mIU/mL)  <1 Week:         5-50  1-2 Weeks:        50-500  2-3 Weeks:        [phone]  3-4 Weeks:        500-10000  4-5 Weeks:        1000-50000  5-6 Weeks:        10000-100000  6-8 Weeks:        15000-200000  2-3 Months:       10000-100000      Results for orders placed or performed in visit on  01/23/20  CBC  Result Value Ref Range   WBC 5.6 4.0 - 10.5 K/uL   RBC 3.52 (L) 3.87 - 5.11 Mil/uL   Platelets 288.0 150.0 - 400.0 K/uL   Hemoglobin 11.2 (L) 12.0 - 15.0 g/dL   HCT 33.7 (L) 36.0 - 46.0 %   MCV 95.7 78.0 - 100.0 fl   MCHC 33.3 30.0 - 36.0 g/dL   RDW 13.5 11.5 - 15.5 %  Comprehensive metabolic panel  Result Value Ref Range   Sodium 135 135 - 145 mEq/L   Potassium 4.1 3.5 - 5.1 mEq/L   Chloride 104 96 -  112 mEq/L   CO2 26 19 - 32 mEq/L   Glucose, Bld 94 70 - 99 mg/dL   BUN 16 6 - 23 mg/dL   Creatinine, Ser 0.73 0.40 - 1.20 mg/dL   Total Bilirubin 0.4 0.2 - 1.2 mg/dL   Alkaline Phosphatase 45 39 - 117 U/L   AST 13 0 - 37 U/L   ALT 8 0 - 35 U/L   Total Protein 6.2 6.0 - 8.3 g/dL   Albumin 3.7 3.5 - 5.2 g/dL   GFR 108.31 >60.00 mL/min   Calcium 9.1 8.4 - 10.5 mg/dL  Hemoglobin A1c  Result Value Ref Range   Hgb A1c MFr Bld 4.4 (L) 4.6 - 6.5 %  Lipid panel  Result Value Ref Range   Cholesterol 188 0 - 200 mg/dL   Triglycerides 66.0 0.0 - 149.0 mg/dL   HDL 73.10 >39.00 mg/dL   VLDL 13.2 0.0 - 40.0 mg/dL   LDL Cholesterol 102 (H) 0 - 99 mg/dL   Total CHOL/HDL Ratio 3    NonHDL 114.71   TSH  Result Value Ref Range   TSH 1.61 0.35 - 4.50 uIU/mL  POCT urine pregnancy  Result Value Ref Range   Preg Test, Ur Positive (A) Negative

## 2020-01-23 ENCOUNTER — Ambulatory Visit (INDEPENDENT_AMBULATORY_CARE_PROVIDER_SITE_OTHER): Payer: BC Managed Care – PPO | Admitting: Family Medicine

## 2020-01-23 ENCOUNTER — Other Ambulatory Visit: Payer: BC Managed Care – PPO

## 2020-01-23 ENCOUNTER — Other Ambulatory Visit: Payer: Self-pay

## 2020-01-23 ENCOUNTER — Other Ambulatory Visit (HOSPITAL_COMMUNITY)
Admission: RE | Admit: 2020-01-23 | Discharge: 2020-01-23 | Disposition: A | Discharge status: Home / Self Care | Payer: BC Managed Care – PPO | Source: Ambulatory Visit | Attending: Family Medicine | Admitting: Family Medicine

## 2020-01-23 ENCOUNTER — Encounter: Payer: Self-pay | Admitting: Family Medicine

## 2020-01-23 VITALS — BP 107/70 | HR 76 | Temp 97.8°F | Resp 16 | Ht 67.0 in | Wt 229.0 lb

## 2020-01-23 DIAGNOSIS — Z3A01 Less than 8 weeks gestation of pregnancy: Secondary | ICD-10-CM

## 2020-01-23 DIAGNOSIS — Z131 Encounter for screening for diabetes mellitus: Secondary | ICD-10-CM | POA: Diagnosis not present

## 2020-01-23 DIAGNOSIS — Z124 Encounter for screening for malignant neoplasm of cervix: Secondary | ICD-10-CM | POA: Insufficient documentation

## 2020-01-23 DIAGNOSIS — Z130 Encounter for screening for diseases of the blood and blood-forming organs and certain disorders involving the immune mechanism: Secondary | ICD-10-CM

## 2020-01-23 DIAGNOSIS — Z1329 Encounter for screening for other suspected endocrine disorder: Secondary | ICD-10-CM

## 2020-01-23 DIAGNOSIS — Z13220 Encounter for screening for lipoid disorders: Secondary | ICD-10-CM

## 2020-01-23 DIAGNOSIS — N912 Amenorrhea, unspecified: Secondary | ICD-10-CM | POA: Diagnosis not present

## 2020-01-23 DIAGNOSIS — Z114 Encounter for screening for human immunodeficiency virus [HIV]: Secondary | ICD-10-CM

## 2020-01-23 LAB — COMPREHENSIVE METABOLIC PANEL
ALT: 8 U/L (ref 0–35)
AST: 13 U/L (ref 0–37)
Albumin: 3.7 g/dL (ref 3.5–5.2)
Alkaline Phosphatase: 45 U/L (ref 39–117)
BUN: 16 mg/dL (ref 6–23)
CO2: 26 mEq/L (ref 19–32)
Calcium: 9.1 mg/dL (ref 8.4–10.5)
Chloride: 104 mEq/L (ref 96–112)
Creatinine, Ser: 0.73 mg/dL (ref 0.40–1.20)
GFR: 108.31 mL/min (ref >60.00)
Glucose, Bld: 94 mg/dL (ref 70–99)
Potassium: 4.1 mEq/L (ref 3.5–5.1)
Sodium: 135 mEq/L (ref 135–145)
Total Bilirubin: 0.4 mg/dL (ref 0.2–1.2)
Total Protein: 6.2 g/dL (ref 6.0–8.3)

## 2020-01-23 LAB — LIPID PANEL
Cholesterol: 188 mg/dL (ref 0–200)
HDL: 73.1 mg/dL (ref >39.00)
LDL Cholesterol: 102 mg/dL — ABNORMAL HIGH (ref 0–99)
NonHDL: 114.71
Total CHOL/HDL Ratio: 3
Triglycerides: 66 mg/dL (ref 0.0–149.0)
VLDL: 13.2 mg/dL (ref 0.0–40.0)

## 2020-01-23 LAB — POCT URINE PREGNANCY: Preg Test, Ur: POSITIVE — AB

## 2020-01-23 LAB — CBC
HCT: 33.7 % — ABNORMAL LOW (ref 36.0–46.0)
Hemoglobin: 11.2 g/dL — ABNORMAL LOW (ref 12.0–15.0)
MCHC: 33.3 g/dL (ref 30.0–36.0)
MCV: 95.7 fl (ref 78.0–100.0)
Platelets: 288 10*3/uL (ref 150.0–400.0)
RBC: 3.52 Mil/uL — ABNORMAL LOW (ref 3.87–5.11)
RDW: 13.5 % (ref 11.5–15.5)
WBC: 5.6 10*3/uL (ref 4.0–10.5)

## 2020-01-23 LAB — TSH: TSH: 1.61 u[IU]/mL (ref 0.35–4.50)

## 2020-01-23 LAB — HEMOGLOBIN A1C: Hgb A1c MFr Bld: 4.4 % — ABNORMAL LOW (ref 4.6–6.5)

## 2020-01-24 ENCOUNTER — Encounter: Payer: Self-pay | Admitting: Family Medicine

## 2020-01-24 LAB — HIV ANTIBODY (ROUTINE TESTING W REFLEX): HIV 1&2 Ab, 4th Generation: NONREACTIVE

## 2020-01-24 LAB — HCG, QUANTITATIVE, PREGNANCY: HCG, Total, QN: 78310 m[IU]/mL

## 2020-01-27 ENCOUNTER — Telehealth: Payer: Self-pay | Admitting: Obstetrics & Gynecology

## 2020-01-27 ENCOUNTER — Other Ambulatory Visit: Payer: Self-pay | Admitting: Obstetrics & Gynecology

## 2020-01-27 DIAGNOSIS — Z9889 Other specified postprocedural states: Secondary | ICD-10-CM

## 2020-01-27 DIAGNOSIS — Z3201 Encounter for pregnancy test, result positive: Secondary | ICD-10-CM

## 2020-01-27 NOTE — Telephone Encounter (Signed)
TC to pt. NO answer. LMTCB on voicemail.   Shreyas Piatkowski L. Harraway-Smith, M.D., Cherlynn June

## 2020-01-28 ENCOUNTER — Ambulatory Visit (HOSPITAL_BASED_OUTPATIENT_CLINIC_OR_DEPARTMENT_OTHER)
Admission: RE | Admit: 2020-01-28 | Discharge: 2020-01-28 | Disposition: A | Discharge status: Home / Self Care | Payer: BC Managed Care – PPO | Source: Ambulatory Visit | Attending: Obstetrics & Gynecology | Admitting: Obstetrics & Gynecology

## 2020-01-28 ENCOUNTER — Encounter (HOSPITAL_BASED_OUTPATIENT_CLINIC_OR_DEPARTMENT_OTHER): Payer: Self-pay

## 2020-01-28 ENCOUNTER — Encounter: Payer: Self-pay | Admitting: Family Medicine

## 2020-01-28 ENCOUNTER — Other Ambulatory Visit: Payer: Self-pay | Admitting: Obstetrics & Gynecology

## 2020-01-28 ENCOUNTER — Other Ambulatory Visit: Payer: Self-pay

## 2020-01-28 DIAGNOSIS — Z3201 Encounter for pregnancy test, result positive: Secondary | ICD-10-CM | POA: Diagnosis not present

## 2020-01-28 DIAGNOSIS — Z9889 Other specified postprocedural states: Secondary | ICD-10-CM

## 2020-01-28 LAB — CYTOLOGY - PAP
Comment: NEGATIVE
Diagnosis: NEGATIVE
High risk HPV: NEGATIVE

## 2020-01-29 ENCOUNTER — Encounter: Payer: Self-pay | Admitting: Obstetrics & Gynecology

## 2020-01-29 ENCOUNTER — Encounter: Payer: Self-pay | Admitting: Family Medicine

## 2020-01-29 ENCOUNTER — Ambulatory Visit (INDEPENDENT_AMBULATORY_CARE_PROVIDER_SITE_OTHER): Payer: BC Managed Care – PPO | Admitting: Obstetrics & Gynecology

## 2020-01-29 VITALS — BP 116/65 | HR 76 | Ht 67.0 in | Wt 225.0 lb

## 2020-01-29 DIAGNOSIS — Z3201 Encounter for pregnancy test, result positive: Secondary | ICD-10-CM | POA: Diagnosis not present

## 2020-01-29 DIAGNOSIS — N9985 Post endometrial ablation syndrome: Secondary | ICD-10-CM | POA: Diagnosis not present

## 2020-01-29 DIAGNOSIS — Z3A01 Less than 8 weeks gestation of pregnancy: Secondary | ICD-10-CM | POA: Diagnosis not present

## 2020-01-29 NOTE — Progress Notes (Signed)
Patient had positive pregnancy test in primary care office. Patient had ultrasound done yesterday May 18th. Patient is post ablation(12-2017). [name] RN

## 2020-01-29 NOTE — Progress Notes (Signed)
History:  39 y.o. [id number] here today for f/u of pos UPT and review of [place]. Pt is s/p endometrial ablation 01/02/2018. She continued to have reg cycles that were lighter but, she was not on contraception. She was seen for an routine Annual by Dr. Edilia Bo and was noted to have amenorrhea. A preg test was pos.  I ordered an [place] which showed an IUP. Pt is her to discuss her options give the h/o the endometrial ablation..      The following portions of the patient's history were reviewed and updated as appropriate: allergies, current medications, past family history, past medical history, past social history, past surgical history and problem list.  Review of Systems:  Pertinent items are noted in HPI.    Objective:  Physical Exam Blood pressure 116/65, pulse 76, height 5\' 7"  (1.702 m), weight 225 lb (102.1 kg).  CONSTITUTIONAL: Well-developed, well-nourished female in no acute distress.  HENT:  Normocephalic, atraumatic EYES: Conjunctivae and EOM are normal. No scleral icterus.  NECK: Normal range of motion SKIN: Skin is warm and dry. No rash noted. Not diaphoretic.No pallor. [place]: Alert and oriented to person, place, and time. Normal coordination.    Labs and Imaging US OB Comp Less 14 Wks  Result Date: 01/28/2020 CLINICAL DATA:  Initial evaluation for positive pregnancy test. History of prior ablation. EXAM: OBSTETRIC <14 WK ULTRASOUND TECHNIQUE: Transabdominal ultrasound was performed for evaluation of the gestation as well as the maternal uterus and adnexal regions. COMPARISON:  None available. FINDINGS: Intrauterine gestational sac: Single Yolk sac:  Negative. Embryo:  Present Cardiac Activity: Present Heart Rate: 119 bpm CRL: 22.1 mm   8 w 6 d                  [place] EDC: 09/02/2020 Subchorionic hemorrhage:  None visualized. Maternal uterus/adnexae: Right ovary normal in appearance. Left ovary not visualized. No adnexal mass or free fluid. Few fibroid seen within the uterus, 2 largest of  which are measured. 3.6 x 2.1 x 2.8 cm subserosal fibroid present at the posterior uterine body. Additional 2.4 x 1.8 x 2.0 cm intramural fibroid present at the posterior mid uterine body. IMPRESSION: 1. Single viable intrauterine pregnancy, estimated gestational age [redacted] weeks and 6 days by crown-rump length, with ultrasound EDC of 09/02/2020. No complication. 2. Fibroid uterus as above. 3. No other acute maternal uterine or adnexal abnormality identified. Electronically Signed   By: Jeannine Boga M.D.   On: 01/28/2020 22:47    Assessment & Plan:  8 week IUP noted after endometrial ablation. I discussed with pt in detail the risks of pregnancy after endometrial ablation including but, not limited to placental imp[lantation issues, IUGR and increased risks of fetal loss. I have explained that she could have a surgical emergency including a hysterectomy.  Pt could also have a pregnancy with no complications.  I discussed all of the above and answered all of her questions. Pt wants to think about her options and follow up. Her mother has been ill and she does not want to add further stress to her at present.   Total face-to-face time with patient was 40 min.  Greater than 50% was spent in counseling and coordination of care with the patient.   Quina Wilbourne L. Harraway-Smith, M.D., Cherlynn June

## 2020-02-06 ENCOUNTER — Encounter: Payer: Self-pay | Admitting: Obstetrics & Gynecology

## 2020-02-18 ENCOUNTER — Encounter: Payer: BC Managed Care – PPO | Admitting: Advanced Practice Midwife

## 2020-02-24 ENCOUNTER — Ambulatory Visit (INDEPENDENT_AMBULATORY_CARE_PROVIDER_SITE_OTHER): Payer: BC Managed Care – PPO | Admitting: Obstetrics & Gynecology

## 2020-02-24 ENCOUNTER — Other Ambulatory Visit (HOSPITAL_COMMUNITY)
Admission: RE | Admit: 2020-02-24 | Discharge: 2020-02-24 | Disposition: A | Discharge status: Home / Self Care | Payer: BC Managed Care – PPO | Source: Ambulatory Visit | Attending: Advanced Practice Midwife | Admitting: Advanced Practice Midwife

## 2020-02-24 ENCOUNTER — Encounter: Payer: Self-pay | Admitting: Obstetrics & Gynecology

## 2020-02-24 ENCOUNTER — Other Ambulatory Visit: Payer: Self-pay

## 2020-02-24 DIAGNOSIS — O3441 Maternal care for other abnormalities of cervix, first trimester: Secondary | ICD-10-CM | POA: Diagnosis not present

## 2020-02-24 DIAGNOSIS — O09521 Supervision of elderly multigravida, first trimester: Secondary | ICD-10-CM | POA: Diagnosis not present

## 2020-02-24 DIAGNOSIS — O0990 Supervision of high risk pregnancy, unspecified, unspecified trimester: Secondary | ICD-10-CM | POA: Insufficient documentation

## 2020-02-24 DIAGNOSIS — O99891 Other specified diseases and conditions complicating pregnancy: Secondary | ICD-10-CM

## 2020-02-24 DIAGNOSIS — E669 Obesity, unspecified: Secondary | ICD-10-CM

## 2020-02-24 DIAGNOSIS — O0991 Supervision of high risk pregnancy, unspecified, first trimester: Secondary | ICD-10-CM

## 2020-02-24 DIAGNOSIS — O3440 Maternal care for other abnormalities of cervix, unspecified trimester: Secondary | ICD-10-CM

## 2020-02-24 DIAGNOSIS — O99211 Obesity complicating pregnancy, first trimester: Secondary | ICD-10-CM

## 2020-02-24 DIAGNOSIS — O09899 Supervision of other high risk pregnancies, unspecified trimester: Secondary | ICD-10-CM

## 2020-02-24 DIAGNOSIS — O09529 Supervision of elderly multigravida, unspecified trimester: Secondary | ICD-10-CM | POA: Insufficient documentation

## 2020-02-24 DIAGNOSIS — Z283 Underimmunization status: Secondary | ICD-10-CM

## 2020-02-24 DIAGNOSIS — O99210 Obesity complicating pregnancy, unspecified trimester: Secondary | ICD-10-CM | POA: Insufficient documentation

## 2020-02-24 DIAGNOSIS — Z3A12 12 weeks gestation of pregnancy: Secondary | ICD-10-CM

## 2020-02-24 NOTE — Progress Notes (Signed)
  Subjective:    Stefanie Hale is being seen today for her first obstetrical visit. [id number] LMP 11/25/2019. Pt is s/p endometrial ablation 01/02/2018   This is not a planned pregnancy. She is at [redacted]w[redacted]d gestation. Her obstetrical history is significant for advanced maternal age and prev endometrial ablation. . Relationship with FOB: significant other, living together. Patient does intend to breast feed. Pregnancy history fully reviewed. FOB no medical problems. Partners child has cancer as a child. Pt will find out the details. That child is 47 year old. S/p PAP and annual 01/23/2020.   Patient reports no complaints.  Review of Systems:   Review of Systems  Objective:    BP 113/67   Pulse 77   Wt 228 lb (103.4 kg)   LMP 11/27/2019 (Exact Date)   BMI 35.71 kg/m  Physical Exam  Exam CONSTITUTIONAL: Well-developed, well-nourished female in no acute distress.  HENT:  Normocephalic, atraumatic EYES: Conjunctivae and EOM are normal. No scleral icterus.  NECK: Normal range of motion SKIN: Skin is warm and dry. No rash noted. Not diaphoretic.No pallor. [place]: Alert and oriented to person, place, and time. Normal coordination.    Assessment:    Pregnancy: [id number] Patient Active Problem List   Diagnosis Date Noted  . Supervision of high risk pregnancy, antepartum 02/24/2020  . AMA (advanced maternal age) multigravida 35+ 02/24/2020  . Obesity affecting pregnancy, antepartum 02/24/2020  . History of surgery involving uterine cervix, antepartum 02/24/2020  . Right knee injury, initial encounter 06/18/2019  . Fibroids 01/02/2018  . Medial tibial stress syndrome, right, initial encounter 10/25/2016  . Sinusitis 07/25/2013  . Irregular menses 07/01/2013  . Unspecified contraceptive management 07/01/2013  . Back pain 01/01/2012  . Rash 12/28/2011  . Vaginal discharge 12/28/2011  . Anemia 12/28/2011  . SOMNOLENCE 11/24/2010  . EPISTAXIS, RECURRENT 11/24/2010       Plan:     Reviewed again the risks of pregnancy following endometrial ablation. Pt expresses understanding. Also reviewed the increased risks due to [facility]. Will order [place] and have MFM eval placenta with [place] pt may need MRI if [place] is abnormal.     Initial labs drawn. Prenatal vitamins. Problem list reviewed and updated. Genetic testing discussed: requested. NIPS sent today  Role of ultrasound in pregnancy discussed; fetal survey: requested. Amniocentesis discussed: not indicated. Follow up in 4 weeks. 70% of 30 min visit spent on counseling and coordination of care.  Anatomy scan and eval of placenta   Lavonia Drafts 02/24/2020

## 2020-02-24 NOTE — Progress Notes (Signed)
DATING AND VIABILITY SONOGRAM   Stefanie Hale is a 39 y.o. year old G58P2002 with LMP Patient's last menstrual period was 11/27/2019 (exact date). which would correlate to  [redacted]w[redacted]d weeks gestation.  She has had an ablation done on  She is here today for a confirmatory initial sonogram.    GESTATION: SINGLETON   FETAL ACTIVITY:          Heart rate        160 bpm          The fetus is active.    GESTATIONAL AGE AND  BIOMETRICS:  Gestational criteria: Estimated Date of Delivery: 09/02/20 by early ultrasound now at [redacted]w[redacted]d  Previous Scans:1      CROWN RUMP LENGTH           6.54 cm        12-6weeks                                                                               AVERAGE EGA(BY THIS SCAN): 12-6 weeks  WORKING EDD( early ultrasound ):  09-02-2020     TECHNICIAN COMMENTS: Patient informed that the ultrasound is considered a limited obstetric ultrasound and is not intended to be a complete ultrasound exam. Patient also informed that the ultrasound is not being completed with the intent of assessing for fetal or placental anomalies or any pelvic abnormalities. Explained that the purpose of today's ultrasound is to assess for fetal heart rate. Patient acknowledges the purpose of the exam and the limitations of the study.   [name] 02/24/2020 11:02 AM

## 2020-02-24 NOTE — Patient Instructions (Signed)

## 2020-02-24 NOTE — Addendum Note (Signed)
Addended by: Wendelyn Breslow L on: 02/24/2020 11:51 AM   Modules accepted: Orders

## 2020-02-25 LAB — GC/CHLAMYDIA PROBE AMP (~~LOC~~) NOT AT ARMC
Chlamydia: NEGATIVE
Comment: NEGATIVE
Comment: NORMAL
Neisseria Gonorrhea: NEGATIVE

## 2020-02-26 DIAGNOSIS — O99891 Other specified diseases and conditions complicating pregnancy: Secondary | ICD-10-CM | POA: Insufficient documentation

## 2020-02-26 DIAGNOSIS — O09899 Supervision of other high risk pregnancies, unspecified trimester: Secondary | ICD-10-CM | POA: Insufficient documentation

## 2020-02-26 LAB — OBSTETRIC PANEL, INCLUDING HIV
Antibody Screen: NEGATIVE
Basophils Absolute: 0 10*3/uL (ref 0.0–0.2)
Basos: 0 %
EOS (ABSOLUTE): 0.1 10*3/uL (ref 0.0–0.4)
Eos: 2 %
HIV Screen 4th Generation wRfx: NONREACTIVE
Hematocrit: 36 % (ref 34.0–46.6)
Hemoglobin: 11.9 g/dL (ref 11.1–15.9)
Hepatitis B Surface Ag: NEGATIVE
Immature Grans (Abs): 0 10*3/uL (ref 0.0–0.1)
Immature Granulocytes: 0 %
Lymphocytes Absolute: 1.3 10*3/uL (ref 0.7–3.1)
Lymphs: 24 %
MCH: 31.7 pg (ref 26.6–33.0)
MCHC: 33.1 g/dL (ref 31.5–35.7)
MCV: 96 fL (ref 79–97)
Monocytes Absolute: 0.4 10*3/uL (ref 0.1–0.9)
Monocytes: 7 %
Neutrophils Absolute: 3.6 10*3/uL (ref 1.4–7.0)
Neutrophils: 67 %
Platelets: 275 10*3/uL (ref 150–450)
RBC: 3.75 x10E6/uL — ABNORMAL LOW (ref 3.77–5.28)
RDW: 11.9 % (ref 11.7–15.4)
RPR Ser Ql: NONREACTIVE
Rh Factor: POSITIVE
Rubella Antibodies, IGG: <0.9 index — ABNORMAL LOW (ref >0.99)
WBC: 5.4 10*3/uL (ref 3.4–10.8)

## 2020-02-26 LAB — HGB FRACTIONATION CASCADE
Hgb A2: 3 % (ref 1.8–3.2)
Hgb A: 97 % (ref 96.4–98.8)
Hgb F: 0 % (ref 0.0–2.0)
Hgb S: 0 %

## 2020-02-26 LAB — HEPATITIS C ANTIBODY: Hep C Virus Ab: <0.1 s/co ratio (ref 0.0–0.9)

## 2020-02-26 LAB — URINE CULTURE, OB REFLEX

## 2020-02-26 LAB — CULTURE, OB URINE

## 2020-03-04 ENCOUNTER — Telehealth: Payer: Self-pay

## 2020-03-04 NOTE — Telephone Encounter (Signed)
Patient called and made aware that panorama results are low risk. Patient also made aware that she is having a baby girl. [name], RN

## 2020-03-27 ENCOUNTER — Other Ambulatory Visit: Payer: Self-pay

## 2020-03-27 ENCOUNTER — Ambulatory Visit (INDEPENDENT_AMBULATORY_CARE_PROVIDER_SITE_OTHER): Payer: BC Managed Care – PPO | Admitting: Obstetrics & Gynecology

## 2020-03-27 VITALS — BP 118/71 | HR 72 | Wt 227.0 lb

## 2020-03-27 DIAGNOSIS — O09529 Supervision of elderly multigravida, unspecified trimester: Secondary | ICD-10-CM

## 2020-03-27 DIAGNOSIS — O0990 Supervision of high risk pregnancy, unspecified, unspecified trimester: Secondary | ICD-10-CM

## 2020-03-27 DIAGNOSIS — O99212 Obesity complicating pregnancy, second trimester: Secondary | ICD-10-CM

## 2020-03-27 DIAGNOSIS — O09899 Supervision of other high risk pregnancies, unspecified trimester: Secondary | ICD-10-CM

## 2020-03-27 DIAGNOSIS — Z3A17 17 weeks gestation of pregnancy: Secondary | ICD-10-CM

## 2020-03-27 DIAGNOSIS — O09522 Supervision of elderly multigravida, second trimester: Secondary | ICD-10-CM

## 2020-03-27 DIAGNOSIS — O3442 Maternal care for other abnormalities of cervix, second trimester: Secondary | ICD-10-CM

## 2020-03-27 DIAGNOSIS — Z283 Underimmunization status: Secondary | ICD-10-CM

## 2020-03-27 DIAGNOSIS — O99210 Obesity complicating pregnancy, unspecified trimester: Secondary | ICD-10-CM

## 2020-03-27 DIAGNOSIS — O3440 Maternal care for other abnormalities of cervix, unspecified trimester: Secondary | ICD-10-CM

## 2020-03-27 NOTE — Progress Notes (Signed)
   PRENATAL VISIT NOTE  Subjective:  Page L Stefanie Hale is a 39 y.o. [id number] at [redacted]w[redacted]d being seen today for ongoing prenatal care.  She is currently monitored for the following issues for this high-risk pregnancy and has SOMNOLENCE; EPISTAXIS, RECURRENT; Rash; Vaginal discharge; Anemia; Back pain; Irregular menses; Unspecified contraceptive management; Sinusitis; Medial tibial stress syndrome, right, initial encounter; Fibroids; Right knee injury, initial encounter; Supervision of high risk pregnancy, antepartum; AMA (advanced maternal age) multigravida 35+; Obesity affecting pregnancy, antepartum; History of surgery involving uterine cervix, antepartum; and Rubella non-immune status, antepartum on their problem list.  Patient reports no complaints.  Contractions: Not present. Vag. Bleeding: None.  Movement: Present. Denies leaking of fluid.   The following portions of the patient's history were reviewed and updated as appropriate: allergies, current medications, past family history, past medical history, past social history, past surgical history and problem list.   Objective:   Vitals:   03/27/20 0926  BP: 118/71  Pulse: 72    Fetal Status: Fetal Heart Rate (bpm): 150   Movement: Present     General:  Alert, oriented and cooperative. Patient is in no acute distress.  Skin: Skin is warm and dry. No rash noted.   Cardiovascular: Normal heart rate noted  Respiratory: Normal respiratory effort, no problems with respiration noted  Abdomen: Soft, gravid, appropriate for gestational age.  Pain/Pressure: Absent     Pelvic: Cervical exam deferred        Extremities: Normal range of motion.  Edema: None  Mental Status: Normal mood and affect. Normal behavior. Normal judgment and thought content.   Assessment and Plan:  Pregnancy: G3P2002 at [redacted]w[redacted]d 1. Supervision of high risk pregnancy, antepartum Anatomy scan with eval of placenta  2. Rubella non-immune status, antepartum Needs vac  PP  3. Antepartum multigravida of advanced maternal age 39 ASA daily   4. Obesity affecting pregnancy, antepartum  5. History of surgery involving uterine cervix, antepartum Anatomy scan High risk for placentation abnormalities due to h/o endometrial ablation   Preterm labor symptoms and general obstetric precautions including but not limited to vaginal bleeding, contractions, leaking of fluid and fetal movement were reviewed in detail with the patient. Please refer to After Visit Summary for other counseling recommendations.   Return in about 4 weeks (around 04/24/2020) for in person.  Future Appointments  Date Time Provider [place]  04/13/2020 10:15 AM [facility] NURSE [facility]  04/13/2020 10:15 AM [facility]    Lavonia Drafts, MD

## 2020-03-27 NOTE — Addendum Note (Signed)
Addended by: Phill Myron on: 03/27/2020 09:59 AM   Modules accepted: Orders

## 2020-04-10 ENCOUNTER — Inpatient Hospital Stay (HOSPITAL_COMMUNITY)
Admission: AD | Admit: 2020-04-10 | Discharge: 2020-04-10 | Disposition: A | Discharge status: Home / Self Care | Payer: BC Managed Care – PPO | Attending: Obstetrics and Gynecology | Admitting: Obstetrics and Gynecology

## 2020-04-10 ENCOUNTER — Encounter (HOSPITAL_COMMUNITY): Payer: Self-pay | Admitting: Obstetrics and Gynecology

## 2020-04-10 ENCOUNTER — Other Ambulatory Visit: Payer: Self-pay

## 2020-04-10 DIAGNOSIS — R109 Unspecified abdominal pain: Secondary | ICD-10-CM | POA: Diagnosis present

## 2020-04-10 DIAGNOSIS — Z7982 Long term (current) use of aspirin: Secondary | ICD-10-CM | POA: Diagnosis not present

## 2020-04-10 DIAGNOSIS — Z9889 Other specified postprocedural states: Secondary | ICD-10-CM

## 2020-04-10 DIAGNOSIS — R1030 Lower abdominal pain, unspecified: Secondary | ICD-10-CM

## 2020-04-10 DIAGNOSIS — O26892 Other specified pregnancy related conditions, second trimester: Secondary | ICD-10-CM | POA: Diagnosis not present

## 2020-04-10 DIAGNOSIS — Z3A19 19 weeks gestation of pregnancy: Secondary | ICD-10-CM | POA: Insufficient documentation

## 2020-04-10 LAB — URINALYSIS, ROUTINE W REFLEX MICROSCOPIC
Bilirubin Urine: NEGATIVE
Glucose, UA: NEGATIVE mg/dL
Hgb urine dipstick: NEGATIVE
Ketones, ur: NEGATIVE mg/dL
Leukocytes,Ua: NEGATIVE
Nitrite: NEGATIVE
Protein, ur: NEGATIVE mg/dL
Specific Gravity, Urine: 1.004 — ABNORMAL LOW (ref 1.005–1.030)
pH: 6 (ref 5.0–8.0)

## 2020-04-10 NOTE — MAU Note (Signed)
Having real bad pains in lower abd, like menstrual cramps.  Started Wed morning, not as bad now, but still coming. Denies GI or GU complaints.

## 2020-04-10 NOTE — MAU Provider Note (Signed)
History     CSN: [id number]  Arrival date and time: 04/10/20 1427   None     Chief Complaint  Patient presents with  . Abdominal Pain   HPI  This is a [id number] at [redacted]w[redacted]d w hx of endometrial ablation who is presenting for abdominal pain. Cramping started Wednesday evening. Comes and goes. Relieved by tylenol. Today it is less intense. Last dose of tylenol was this morning at 1030. No nausea, vomiting, diarrhea. No vaginal bleeding or abnormal discharge. Endorses some urinary frequency, no dysuria. Has started to feel some fetal movement.   FHR 147  No known sick contacts.  No new sexual partners.  Hx of endometrial ablation in 2019.     Past Medical History:  Diagnosis Date  . Anemia   . Childhood asthma    no inhaler,  no problems as adult  . Complication of anesthesia   . SVD (spontaneous vaginal delivery)    x 2    Past Surgical History:  Procedure Laterality Date  . HYSTEROSCOPY WITH D & C Bilateral 01/02/2018   Procedure: DILATATION AND CURETTAGE /HYSTEROSCOPY MYOSURE MYOMECTOMY AND NOVASURE ENDOMETRIAL ABLATION;  Surgeon: Lavonia Drafts, MD;  Location: [facility];  Service: Gynecology;  Laterality: Bilateral;  . TONSILLECTOMY      Family History  Problem Relation Age of Onset  . Arthritis Other   . Hypertension Other   . Stroke Other   . Lupus Mother   . Congestive Heart Failure Mother   . Cancer Mother   . Hypertension Mother   . Diabetes Mother     Social History   Tobacco Use  . Smoking status: Never Smoker  . Smokeless tobacco: Never Used  Vaping Use  . Vaping Use: Never used  Substance Use Topics  . Alcohol use: Never  . Drug use: No    Allergies:  Allergies  Allergen Reactions  . Influenza Vaccines Other (See Comments)    Arm swelling and felt very bad.  . Penicillins     REACTION: hives    Medications Prior to Admission  Medication Sig Dispense Refill Last Dose  . aspirin EC 81 MG tablet Take 81 mg by mouth daily. Swallow  whole.   04/10/2020 at 0930  . Prenatal Vit-Fe Fumarate-FA (PRENATAL VITAMINS PO) Take by mouth.   04/10/2020 at 0930    Review of Systems  Constitutional: Negative.   Gastrointestinal: Negative for abdominal distention, constipation, diarrhea, nausea and vomiting.  Genitourinary: Negative for difficulty urinating, dyspareunia, dysuria, vaginal bleeding, vaginal discharge and vaginal pain.  Neurological: Negative for dizziness and headaches.   Physical Exam   Blood pressure 115/74, pulse 74, temperature 98.3 F (36.8 C), temperature source Oral, resp. rate 16, height 5\' 7"  (1.702 m), weight (!) 103 kg, last menstrual period 11/27/2019, SpO2 100 %.  Physical Exam Vitals and nursing note reviewed.  Constitutional:      Appearance: She is well-developed.  Abdominal:     General: Bowel sounds are normal.     Palpations: Abdomen is soft.     Tenderness: There is abdominal tenderness in the suprapubic area. There is no rebound.  Genitourinary:    Comments: Palpable gravid uterus. On SVE Cervix is closed Skin:    General: Skin is warm.  Neurological:     Mental Status: She is alert.  Psychiatric:        Mood and Affect: Mood normal.        Behavior: Behavior normal.     MAU Course  Procedures  MDM Patient evaluated. Cervical exam is closed/long/high. Normal FHR. Overall benign exam.  UA obtained and reviewed. Negative nitrites, negative LE.  Return precautions provided. Patient has [place] scheduled for Monday and f/u OB visit in 2 weeks.   Assessment and Plan    Abdominal pain, hx of endometrial ablation  -UA neg  -continue tylenol prn -follow up Monday w OB US and ROB visit in two weeks.  -return precautions provided.   Stefanie Hale 04/10/2020, 3:09 PM

## 2020-04-10 NOTE — Discharge Instructions (Signed)
Abdominal Pain During Pregnancy  Abdominal pain is common during pregnancy, and has many possible causes. Some causes are more serious than others, and sometimes the cause is not known. Abdominal pain can be a sign that labor is starting. It can also be caused by normal growth and stretching of muscles and ligaments during pregnancy. Always tell your health care provider if you have any abdominal pain. Follow these instructions at home:  Do not have sex or put anything in your vagina until your pain goes away completely.  Get plenty of rest until your pain improves.  Drink enough fluid to keep your urine pale yellow.  Take over-the-counter and prescription medicines only as told by your health care provider.  Keep all follow-up visits as told by your health care provider. This is important. Contact a health care provider if:  Your pain continues or gets worse after resting.  You have lower abdominal pain that: ? Comes and goes at regular intervals. ? Spreads to your back. ? Is similar to menstrual cramps.  You have pain or burning when you urinate. Get help right away if:  You have a fever or chills.  You have vaginal bleeding.  You are leaking fluid from your vagina.  You are passing tissue from your vagina.  You have vomiting or diarrhea that lasts for more than 24 hours.  Your baby is moving less than usual.  You feel very weak or faint.  You have shortness of breath.  You develop severe pain in your upper abdomen. Summary  Abdominal pain is common during pregnancy, and has many possible causes.  If you experience abdominal pain during pregnancy, tell your health care provider right away.  Follow your health care provider's home care instructions and keep all follow-up visits as directed. This information is not intended to replace advice given to you by your health care provider. Make sure you discuss any questions you have with your health care  provider. Document Revised: 12/17/2018 Document Reviewed: 12/01/2016 Elsevier Patient Education  2020 Elsevier Inc.  

## 2020-04-13 ENCOUNTER — Encounter: Payer: Self-pay | Admitting: *Deleted

## 2020-04-13 ENCOUNTER — Ambulatory Visit: Payer: BC Managed Care – PPO | Attending: Obstetrics and Gynecology

## 2020-04-13 ENCOUNTER — Other Ambulatory Visit: Payer: Self-pay | Admitting: *Deleted

## 2020-04-13 ENCOUNTER — Other Ambulatory Visit: Payer: Self-pay

## 2020-04-13 ENCOUNTER — Ambulatory Visit: Payer: BC Managed Care – PPO | Admitting: *Deleted

## 2020-04-13 DIAGNOSIS — O0990 Supervision of high risk pregnancy, unspecified, unspecified trimester: Secondary | ICD-10-CM

## 2020-04-13 DIAGNOSIS — O0992 Supervision of high risk pregnancy, unspecified, second trimester: Secondary | ICD-10-CM | POA: Diagnosis not present

## 2020-04-13 DIAGNOSIS — O3412 Maternal care for benign tumor of corpus uteri, second trimester: Secondary | ICD-10-CM

## 2020-04-13 DIAGNOSIS — O26892 Other specified pregnancy related conditions, second trimester: Secondary | ICD-10-CM

## 2020-04-13 DIAGNOSIS — O99210 Obesity complicating pregnancy, unspecified trimester: Secondary | ICD-10-CM

## 2020-04-13 DIAGNOSIS — Z3A19 19 weeks gestation of pregnancy: Secondary | ICD-10-CM

## 2020-04-13 DIAGNOSIS — O3440 Maternal care for other abnormalities of cervix, unspecified trimester: Secondary | ICD-10-CM | POA: Insufficient documentation

## 2020-04-13 DIAGNOSIS — O99891 Other specified diseases and conditions complicating pregnancy: Secondary | ICD-10-CM | POA: Insufficient documentation

## 2020-04-13 DIAGNOSIS — O322XX0 Maternal care for transverse and oblique lie, not applicable or unspecified: Secondary | ICD-10-CM

## 2020-04-13 DIAGNOSIS — O3442 Maternal care for other abnormalities of cervix, second trimester: Secondary | ICD-10-CM

## 2020-04-13 DIAGNOSIS — D259 Leiomyoma of uterus, unspecified: Secondary | ICD-10-CM

## 2020-04-13 DIAGNOSIS — Z283 Underimmunization status: Secondary | ICD-10-CM

## 2020-04-13 DIAGNOSIS — Z362 Encounter for other antenatal screening follow-up: Secondary | ICD-10-CM

## 2020-04-13 DIAGNOSIS — Z363 Encounter for antenatal screening for malformations: Secondary | ICD-10-CM

## 2020-04-13 DIAGNOSIS — O09899 Supervision of other high risk pregnancies, unspecified trimester: Secondary | ICD-10-CM

## 2020-04-13 DIAGNOSIS — O09522 Supervision of elderly multigravida, second trimester: Secondary | ICD-10-CM

## 2020-04-13 DIAGNOSIS — O3429 Maternal care due to uterine scar from other previous surgery: Secondary | ICD-10-CM

## 2020-04-13 DIAGNOSIS — Z3686 Encounter for antenatal screening for cervical length: Secondary | ICD-10-CM

## 2020-04-13 DIAGNOSIS — Z2839 Other underimmunization status: Secondary | ICD-10-CM

## 2020-04-27 ENCOUNTER — Other Ambulatory Visit: Payer: Self-pay

## 2020-04-27 ENCOUNTER — Ambulatory Visit (INDEPENDENT_AMBULATORY_CARE_PROVIDER_SITE_OTHER): Payer: BC Managed Care – PPO | Admitting: Family Medicine

## 2020-04-27 VITALS — BP 94/62 | HR 79 | Wt 233.0 lb

## 2020-04-27 DIAGNOSIS — O0990 Supervision of high risk pregnancy, unspecified, unspecified trimester: Secondary | ICD-10-CM

## 2020-04-27 DIAGNOSIS — Z283 Underimmunization status: Secondary | ICD-10-CM

## 2020-04-27 DIAGNOSIS — O3440 Maternal care for other abnormalities of cervix, unspecified trimester: Secondary | ICD-10-CM

## 2020-04-27 DIAGNOSIS — O09522 Supervision of elderly multigravida, second trimester: Secondary | ICD-10-CM

## 2020-04-27 DIAGNOSIS — O09899 Supervision of other high risk pregnancies, unspecified trimester: Secondary | ICD-10-CM

## 2020-04-27 DIAGNOSIS — Z3A21 21 weeks gestation of pregnancy: Secondary | ICD-10-CM

## 2020-04-27 DIAGNOSIS — O99891 Other specified diseases and conditions complicating pregnancy: Secondary | ICD-10-CM

## 2020-04-27 NOTE — Progress Notes (Signed)
   PRENATAL VISIT NOTE  Subjective:  Stefanie Hale is a 39 y.o. [id number] at 54w5dbeing seen today for ongoing prenatal care.  She is currently monitored for the following issues for this high-risk pregnancy and has SOMNOLENCE; EPISTAXIS, RECURRENT; Rash; Vaginal discharge; Anemia; Back pain; Irregular menses; Unspecified contraceptive management; Sinusitis; Medial tibial stress syndrome, right, initial encounter; Fibroids; Right knee injury, initial encounter; Supervision of high risk pregnancy, antepartum; AMA (advanced maternal age) multigravida 35+; Obesity affecting pregnancy, antepartum; History of surgery involving uterine cervix, antepartum; and Rubella non-immune status, antepartum on their problem list.  Patient reports no complaints.  Contractions: Not present. Vag. Bleeding: None.  Movement: Present. Denies leaking of fluid.   The following portions of the patient's history were reviewed and updated as appropriate: allergies, current medications, past family history, past medical history, past social history, past surgical history and problem list.   Objective:   Vitals:   04/27/20 0944  BP: 94/62  Pulse: 79  Weight: 233 lb (105.7 kg)    Fetal Status: Fetal Heart Rate (bpm): 148   Movement: Present     General:  Alert, oriented and cooperative. Patient is in no acute distress.  Skin: Skin is warm and dry. No rash noted.   Cardiovascular: Normal heart rate noted  Respiratory: Normal respiratory effort, no problems with respiration noted  Abdomen: Soft, gravid, appropriate for gestational age.  Pain/Pressure: Present     Pelvic: Cervical exam deferred        Extremities: Normal range of motion.  Edema: None  Mental Status: Normal mood and affect. Normal behavior. Normal judgment and thought content.   Assessment and Plan:  Pregnancy: G3P2002 at [redacted]w[redacted]d. [redacted] weeks gestation of pregnancy - Hemoglobin A1c - AFP, Serum, Open Spina Bifida  2. Supervision of high risk  pregnancy, antepartum FHT and FH normal  3. Multigravida of advanced maternal age in second trimester On ASA '81mg'$   4. Rubella non-immune status, antepartum MMR post delivery  5. History of surgery involving uterine cervix, antepartum  Preterm labor symptoms and general obstetric precautions including but not limited to vaginal bleeding, contractions, leaking of fluid and fetal movement were reviewed in detail with the patient. Please refer to After Visit Summary for other counseling recommendations.   No follow-ups on file.  Future Appointments  Date Time Provider [place]8/30/2021 11:15 AM [facility]8/30/2021 11:30 AM [facility] US3 [facility]9/13/2021 10:15 AM Stefanie Hale [facility] None    JaTruett Hale

## 2020-04-29 LAB — AFP, SERUM, OPEN SPINA BIFIDA
AFP MoM: 1.19
AFP Value: 71.3 ng/mL
Gest. Age on Collection Date: 21.5 weeks
Maternal Age At EDD: 38 yr
OSBR Risk 1 IN: 10000
Test Results:: NEGATIVE
Weight: 233 [lb_av]

## 2020-04-29 LAB — HEMOGLOBIN A1C
Est. average glucose Bld gHb Est-mCnc: 85 mg/dL
Hgb A1c MFr Bld: 4.6 % — ABNORMAL LOW (ref 4.8–5.6)

## 2020-05-08 ENCOUNTER — Other Ambulatory Visit: Payer: Self-pay | Admitting: Obstetrics & Gynecology

## 2020-05-08 DIAGNOSIS — O219 Vomiting of pregnancy, unspecified: Secondary | ICD-10-CM

## 2020-05-08 MED ORDER — ONDANSETRON 4 MG PO TBDP: 4.0000 mg | ORAL_TABLET | Freq: Four times a day (QID) | ORAL | 0 refills | Status: DC | PRN

## 2020-05-11 ENCOUNTER — Ambulatory Visit: Payer: BC Managed Care – PPO | Admitting: *Deleted

## 2020-05-11 ENCOUNTER — Encounter: Payer: Self-pay | Admitting: *Deleted

## 2020-05-11 ENCOUNTER — Other Ambulatory Visit: Payer: Self-pay | Admitting: *Deleted

## 2020-05-11 ENCOUNTER — Ambulatory Visit: Payer: BC Managed Care – PPO | Attending: Obstetrics and Gynecology

## 2020-05-11 ENCOUNTER — Other Ambulatory Visit: Payer: Self-pay

## 2020-05-11 DIAGNOSIS — Z3A23 23 weeks gestation of pregnancy: Secondary | ICD-10-CM

## 2020-05-11 DIAGNOSIS — O0990 Supervision of high risk pregnancy, unspecified, unspecified trimester: Secondary | ICD-10-CM | POA: Diagnosis present

## 2020-05-11 DIAGNOSIS — O3429 Maternal care due to uterine scar from other previous surgery: Secondary | ICD-10-CM

## 2020-05-11 DIAGNOSIS — O3440 Maternal care for other abnormalities of cervix, unspecified trimester: Secondary | ICD-10-CM | POA: Diagnosis present

## 2020-05-11 DIAGNOSIS — Z3686 Encounter for antenatal screening for cervical length: Secondary | ICD-10-CM

## 2020-05-11 DIAGNOSIS — Z362 Encounter for other antenatal screening follow-up: Secondary | ICD-10-CM | POA: Insufficient documentation

## 2020-05-11 DIAGNOSIS — D259 Leiomyoma of uterus, unspecified: Secondary | ICD-10-CM

## 2020-05-11 DIAGNOSIS — Z283 Underimmunization status: Secondary | ICD-10-CM | POA: Diagnosis present

## 2020-05-11 DIAGNOSIS — O99210 Obesity complicating pregnancy, unspecified trimester: Secondary | ICD-10-CM

## 2020-05-11 DIAGNOSIS — Z363 Encounter for antenatal screening for malformations: Secondary | ICD-10-CM | POA: Diagnosis not present

## 2020-05-11 DIAGNOSIS — O99891 Other specified diseases and conditions complicating pregnancy: Secondary | ICD-10-CM | POA: Insufficient documentation

## 2020-05-11 DIAGNOSIS — O26892 Other specified pregnancy related conditions, second trimester: Secondary | ICD-10-CM

## 2020-05-11 DIAGNOSIS — O09522 Supervision of elderly multigravida, second trimester: Secondary | ICD-10-CM | POA: Diagnosis not present

## 2020-05-11 DIAGNOSIS — O09899 Supervision of other high risk pregnancies, unspecified trimester: Secondary | ICD-10-CM

## 2020-05-11 DIAGNOSIS — O3412 Maternal care for benign tumor of corpus uteri, second trimester: Secondary | ICD-10-CM

## 2020-05-11 DIAGNOSIS — Z9889 Other specified postprocedural states: Secondary | ICD-10-CM

## 2020-05-20 ENCOUNTER — Encounter: Payer: Self-pay | Admitting: Family Medicine

## 2020-05-20 ENCOUNTER — Ambulatory Visit (INDEPENDENT_AMBULATORY_CARE_PROVIDER_SITE_OTHER): Payer: BC Managed Care – PPO | Admitting: Family Medicine

## 2020-05-20 ENCOUNTER — Other Ambulatory Visit: Payer: Self-pay

## 2020-05-20 VITALS — BP 110/72 | HR 79 | Temp 98.2°F | Ht 67.0 in | Wt 234.1 lb

## 2020-05-20 DIAGNOSIS — Z0289 Encounter for other administrative examinations: Secondary | ICD-10-CM

## 2020-05-20 NOTE — Progress Notes (Signed)
Chief Complaint  Patient presents with  . complete paperwork    Subjective: Patient is a 39 y.o. female here for encounter to fill out a form.  Here with her mother.  The patient's mother is a patient of mine who has multiple medical comorbidities.  She has 9 different specialist.  She has appointments at least twice per month lasting around 4 hours/day.  The patient is responsible for transporting her mother to these appointments.  She has had this form filled out in the past as well.  Past Medical History:  Diagnosis Date  . Anemia   . Childhood asthma    no inhaler,  no problems as adult  . Complication of anesthesia   . SVD (spontaneous vaginal delivery)    x 2    Objective: BP 110/72 (BP Location: Right Arm, Patient Position: Sitting, Cuff Size: Normal)   Pulse 79   Temp 98.2 F (36.8 C) (Oral)   Ht 5\' 7"  (1.702 m)   Wt 234 lb 2 oz (106.2 kg)   LMP 11/27/2019 (Exact Date)   SpO2 97%   BMI 36.67 kg/m  General: Awake, appears stated age Lungs: No accessory muscle use Psych: Age appropriate judgment and insight, normal affect and mood  Assessment and Plan: Encounter for completion of form with patient  Form completed with patient.  I spent around 20 minutes face-to-face with her visit completed the form.  I will see her as needed. The patient and her mother voiced understanding and agreement to the plan.  [place], DO 05/20/20  1:41 PM

## 2020-05-25 ENCOUNTER — Other Ambulatory Visit: Payer: Self-pay

## 2020-05-25 ENCOUNTER — Ambulatory Visit (INDEPENDENT_AMBULATORY_CARE_PROVIDER_SITE_OTHER): Payer: BC Managed Care – PPO | Admitting: Family Medicine

## 2020-05-25 VITALS — BP 117/70 | HR 83 | Wt 233.0 lb

## 2020-05-25 DIAGNOSIS — O09529 Supervision of elderly multigravida, unspecified trimester: Secondary | ICD-10-CM

## 2020-05-25 DIAGNOSIS — O0990 Supervision of high risk pregnancy, unspecified, unspecified trimester: Secondary | ICD-10-CM

## 2020-05-25 DIAGNOSIS — Z283 Underimmunization status: Secondary | ICD-10-CM

## 2020-05-25 DIAGNOSIS — O09522 Supervision of elderly multigravida, second trimester: Secondary | ICD-10-CM

## 2020-05-25 DIAGNOSIS — O99891 Other specified diseases and conditions complicating pregnancy: Secondary | ICD-10-CM

## 2020-05-25 DIAGNOSIS — Z2839 Other underimmunization status: Secondary | ICD-10-CM

## 2020-05-25 DIAGNOSIS — Z3A25 25 weeks gestation of pregnancy: Secondary | ICD-10-CM

## 2020-05-25 DIAGNOSIS — O3440 Maternal care for other abnormalities of cervix, unspecified trimester: Secondary | ICD-10-CM

## 2020-05-25 NOTE — Progress Notes (Signed)
° °  PRENATAL VISIT NOTE  Subjective:  Stefanie Hale is a 39 y.o. G3P2002 at [redacted]w[redacted]d being seen today for ongoing prenatal care.  She is currently monitored for the following issues for this high-risk pregnancy and has SOMNOLENCE; EPISTAXIS, RECURRENT; Rash; Vaginal discharge; Anemia; Back pain; Irregular menses; Unspecified contraceptive management; Sinusitis; Medial tibial stress syndrome, right, initial encounter; Fibroids; Right knee injury, initial encounter; Supervision of high risk pregnancy, antepartum; AMA (advanced maternal age) multigravida 35+; Obesity affecting pregnancy, antepartum; History of surgery involving uterine cervix, antepartum; and Rubella non-immune status, antepartum on their problem list.  Patient reports backache and cramping. 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 Currently, they are not giving her breaks every couple of hours despite a note from [place]. Was told by manager that she would have to go on leave because there wasn't any light duty work for her to do. Contractions: Not present. Vag. Bleeding: None.  Movement: Present. Denies leaking of fluid.   The following portions of the patient's history were reviewed and updated as appropriate: allergies, current medications, past family history, past medical history, past social history, past surgical history and problem list.   Objective:   Vitals:   05/25/20 1025  BP: 117/70  Pulse: 83  Weight: 233 lb (105.7 kg)    Fetal Status:     Movement: Present     General:  Alert, oriented and cooperative. Patient is in no acute distress.  Skin: Skin is warm and dry. No rash noted.   Cardiovascular: Normal heart rate noted  Respiratory: Normal respiratory effort, no problems with respiration noted  Abdomen: Soft, gravid, appropriate for gestational age.  Pain/Pressure: Present     Pelvic: Cervical exam deferred        Extremities: Normal range of motion.  Edema: None    Mental Status: Normal mood and affect. Normal behavior. Normal judgment and thought content.   Assessment and Plan:  Pregnancy: G3P2002 at [redacted]w[redacted]d 1. [redacted] weeks gestation of pregnancy 2. Supervision of high risk pregnancy, antepartum FHT and FH normal. Will start FMLA at end of month.  3. Multigravida of advanced maternal age in second trimester On ASA $Remo'81mg'rfnuz$   4. Rubella non-immune status, antepartum MMR post delivery  5. History of surgery involving uterine cervix, antepartum H/o endometrium ablation   Preterm labor symptoms and general obstetric precautions including but not limited to vaginal bleeding, contractions, leaking of fluid and fetal movement were reviewed in detail with the patient. Please refer to After Visit Summary for other counseling recommendations.   Return in about 4 weeks (around 06/22/2020) for 2 hr GTT.  Future Appointments  Date Time Provider [place]  06/15/2020 10:30 AM [facility] NURSE [facility]  06/15/2020 10:45 AM [facility] US4 [facility] [place], DO

## 2020-06-01 ENCOUNTER — Telehealth: Payer: Self-pay | Admitting: Family Medicine

## 2020-06-01 NOTE — Telephone Encounter (Signed)
Pt came in today stating forms were not faxed I re-faxed the form to  number on top of page. ([phone])

## 2020-06-04 ENCOUNTER — Telehealth: Payer: Self-pay | Admitting: Family Medicine

## 2020-06-04 NOTE — Telephone Encounter (Signed)
Pt had Stefanie Hale paper work up front in bin since 10.6.2020 and never picked up.  I mailed to home address

## 2020-06-09 ENCOUNTER — Other Ambulatory Visit: Payer: Self-pay

## 2020-06-09 ENCOUNTER — Emergency Department (HOSPITAL_BASED_OUTPATIENT_CLINIC_OR_DEPARTMENT_OTHER)
Admission: EM | Admit: 2020-06-09 | Discharge: 2020-06-09 | Disposition: A | Discharge status: Home / Self Care | Payer: BC Managed Care – PPO | Attending: Emergency Medicine | Admitting: Emergency Medicine

## 2020-06-09 ENCOUNTER — Encounter (HOSPITAL_BASED_OUTPATIENT_CLINIC_OR_DEPARTMENT_OTHER): Payer: Self-pay | Admitting: Emergency Medicine

## 2020-06-09 DIAGNOSIS — Z20822 Contact with and (suspected) exposure to covid-19: Secondary | ICD-10-CM | POA: Diagnosis not present

## 2020-06-09 DIAGNOSIS — O26893 Other specified pregnancy related conditions, third trimester: Secondary | ICD-10-CM | POA: Insufficient documentation

## 2020-06-09 DIAGNOSIS — O99513 Diseases of the respiratory system complicating pregnancy, third trimester: Secondary | ICD-10-CM | POA: Insufficient documentation

## 2020-06-09 DIAGNOSIS — J45909 Unspecified asthma, uncomplicated: Secondary | ICD-10-CM | POA: Diagnosis not present

## 2020-06-09 DIAGNOSIS — O09513 Supervision of elderly primigravida, third trimester: Secondary | ICD-10-CM | POA: Diagnosis not present

## 2020-06-09 DIAGNOSIS — R109 Unspecified abdominal pain: Secondary | ICD-10-CM | POA: Diagnosis not present

## 2020-06-09 DIAGNOSIS — O4693 Antepartum hemorrhage, unspecified, third trimester: Secondary | ICD-10-CM | POA: Insufficient documentation

## 2020-06-09 DIAGNOSIS — O468X2 Other antepartum hemorrhage, second trimester: Secondary | ICD-10-CM

## 2020-06-09 DIAGNOSIS — Z6790 Unspecified blood type, Rh positive: Secondary | ICD-10-CM

## 2020-06-09 DIAGNOSIS — Z3A27 27 weeks gestation of pregnancy: Secondary | ICD-10-CM | POA: Insufficient documentation

## 2020-06-09 DIAGNOSIS — Z7982 Long term (current) use of aspirin: Secondary | ICD-10-CM | POA: Insufficient documentation

## 2020-06-09 DIAGNOSIS — N939 Abnormal uterine and vaginal bleeding, unspecified: Secondary | ICD-10-CM

## 2020-06-09 DIAGNOSIS — N72 Inflammatory disease of cervix uteri: Secondary | ICD-10-CM

## 2020-06-09 LAB — RESPIRATORY PANEL BY RT PCR (FLU A&B, COVID)
Influenza A by PCR: NEGATIVE
Influenza B by PCR: NEGATIVE
SARS Coronavirus 2 by RT PCR: NEGATIVE

## 2020-06-09 LAB — CBC WITH DIFFERENTIAL/PLATELET
Abs Immature Granulocytes: 0.02 10*3/uL (ref 0.00–0.07)
Basophils Absolute: 0.1 10*3/uL (ref 0.0–0.1)
Basophils Relative: 1 %
Eosinophils Absolute: 0.1 10*3/uL (ref 0.0–0.5)
Eosinophils Relative: 2 %
HCT: 34.2 % — ABNORMAL LOW (ref 36.0–46.0)
Hemoglobin: 11.1 g/dL — ABNORMAL LOW (ref 12.0–15.0)
Immature Granulocytes: 0 %
Lymphocytes Relative: 26 %
Lymphs Abs: 1.6 10*3/uL (ref 0.7–4.0)
MCH: 31.9 pg (ref 26.0–34.0)
MCHC: 32.5 g/dL (ref 30.0–36.0)
MCV: 98.3 fL (ref 80.0–100.0)
Monocytes Absolute: 0.4 10*3/uL (ref 0.1–1.0)
Monocytes Relative: 7 %
Neutro Abs: 3.9 10*3/uL (ref 1.7–7.7)
Neutrophils Relative %: 64 %
Platelets: 209 10*3/uL (ref 150–400)
RBC: 3.48 MIL/uL — ABNORMAL LOW (ref 3.87–5.11)
RDW: 12.9 % (ref 11.5–15.5)
WBC: 6.1 10*3/uL (ref 4.0–10.5)
nRBC: 0 % (ref 0.0–0.2)

## 2020-06-09 LAB — URINALYSIS, ROUTINE W REFLEX MICROSCOPIC
Bilirubin Urine: NEGATIVE
Glucose, UA: NEGATIVE mg/dL
Ketones, ur: NEGATIVE mg/dL
Leukocytes,Ua: NEGATIVE
Nitrite: NEGATIVE
Protein, ur: NEGATIVE mg/dL
Specific Gravity, Urine: 1.015 (ref 1.005–1.030)
pH: 5 (ref 5.0–8.0)

## 2020-06-09 LAB — FIBRINOGEN: Fibrinogen: 400 mg/dL (ref 210–475)

## 2020-06-09 LAB — COMPREHENSIVE METABOLIC PANEL
ALT: 18 U/L (ref 0–44)
AST: 18 U/L (ref 15–41)
Albumin: 2.9 g/dL — ABNORMAL LOW (ref 3.5–5.0)
Alkaline Phosphatase: 49 U/L (ref 38–126)
Anion gap: 11 (ref 5–15)
BUN: 14 mg/dL (ref 6–20)
CO2: 23 mmol/L (ref 22–32)
Calcium: 8.9 mg/dL (ref 8.9–10.3)
Chloride: 103 mmol/L (ref 98–111)
Creatinine, Ser: 0.62 mg/dL (ref 0.44–1.00)
GFR calc Af Amer: >60 mL/min (ref >60)
GFR calc non Af Amer: >60 mL/min (ref >60)
Glucose, Bld: 84 mg/dL (ref 70–99)
Potassium: 3.7 mmol/L (ref 3.5–5.1)
Sodium: 137 mmol/L (ref 135–145)
Total Bilirubin: 0.3 mg/dL (ref 0.3–1.2)
Total Protein: 6.5 g/dL (ref 6.5–8.1)

## 2020-06-09 LAB — WET PREP, GENITAL
Sperm: NONE SEEN
Trich, Wet Prep: NONE SEEN
Yeast Wet Prep HPF POC: NONE SEEN

## 2020-06-09 LAB — APTT: aPTT: 27 seconds (ref 24–36)

## 2020-06-09 LAB — ABO/RH: ABO/RH(D): B POS

## 2020-06-09 LAB — PROTIME-INR
INR: 1 (ref 0.8–1.2)
Prothrombin Time: 13.2 seconds (ref 11.4–15.2)

## 2020-06-09 MED ORDER — LACTATED RINGERS IV BOLUS
1000.0000 mL | Freq: Once | INTRAVENOUS | Status: AC
  Administered 2020-06-09: 1000 mL via INTRAVENOUS

## 2020-06-09 NOTE — ED Provider Notes (Signed)
[facility] EMERGENCY DEPARTMENT Provider Note   CSN: [id number] Arrival date & time: 06/09/20  4580     History Chief Complaint  Patient presents with  . Vaginal Bleeding    Stefanie Hale is a 39 y.o. female.  39 year old female G3, P2 at approximately 27 weeks 1 day with a previously verified intrauterine pregnancy presents the emerge department today with mild vaginal bleeding.  Patient states that she was at work and she went and urinated.  After she urinated she wiped that she noticed some blood on the tissue.  Has not had any persistent vaginal bleeding besides that.  She denies any spotting before that.  She has some abdominal cramping for the last 1 to 2 months without unchanged today.  She does not know of any high risk features for her pregnancy besides her age and weight.  She states that the fetus is in a transverse lie at this point.  Still feel the baby move.  No leakage of fluid or contractions aside from the abdominal cramping.  No other recent illnesses.  No other trauma or associated symptoms.   Vaginal Bleeding      Past Medical History:  Diagnosis Date  . Anemia   . Childhood asthma    no inhaler,  no problems as adult  . Complication of anesthesia   . SVD (spontaneous vaginal delivery)    x 2    Patient Active Problem List   Diagnosis Date Noted  . Rubella non-immune status, antepartum 02/26/2020  . Supervision of high risk pregnancy, antepartum 02/24/2020  . AMA (advanced maternal age) multigravida 35+ 02/24/2020  . Obesity affecting pregnancy, antepartum 02/24/2020  . History of surgery involving uterine cervix, antepartum 02/24/2020  . Right knee injury, initial encounter 06/18/2019  . Fibroids 01/02/2018  . Medial tibial stress syndrome, right, initial encounter 10/25/2016  . Sinusitis 07/25/2013  . Irregular menses 07/01/2013  . Unspecified contraceptive management 07/01/2013  . Back pain 01/01/2012  . Rash 12/28/2011  .  Vaginal discharge 12/28/2011  . Anemia 12/28/2011  . SOMNOLENCE 11/24/2010  . EPISTAXIS, RECURRENT 11/24/2010    Past Surgical History:  Procedure Laterality Date  . HYSTEROSCOPY WITH D & C Bilateral 01/02/2018   Procedure: DILATATION AND CURETTAGE /HYSTEROSCOPY MYOSURE MYOMECTOMY AND NOVASURE ENDOMETRIAL ABLATION;  Surgeon: Lavonia Drafts, MD;  Location: [facility];  Service: Gynecology;  Laterality: Bilateral;  . TONSILLECTOMY       OB History    Gravida  3   Para  2   Term  2   Preterm      AB      Living  2     SAB      TAB      Ectopic      Multiple      Live Births  2           Family History  Problem Relation Age of Onset  . Arthritis Other   . Hypertension Other   . Stroke Other   . Lupus Mother   . Congestive Heart Failure Mother   . Cancer Mother   . Hypertension Mother   . Diabetes Mother     Social History   Tobacco Use  . Smoking status: Never Smoker  . Smokeless tobacco: Never Used  Vaping Use  . Vaping Use: Never used  Substance Use Topics  . Alcohol use: Never  . Drug use: No    Home Medications Prior to Admission medications   Medication  Sig Start Date End Date Taking? Authorizing Provider  aspirin EC 81 MG tablet Take 81 mg by mouth daily. Swallow whole.    [provider]  ondansetron (ZOFRAN ODT) 4 MG disintegrating tablet Take 1 tablet (4 mg total) by mouth every 6 (six) hours as needed for nausea. 05/08/20   Lavonia Drafts, MD  Prenatal Vit-Fe Fumarate-FA (PRENATAL VITAMINS PO) Take by mouth.    [provider]    Allergies    Influenza vaccines and Penicillins  Review of Systems   Review of Systems  Genitourinary: Positive for vaginal bleeding.  All other systems reviewed and are negative.   Physical Exam Updated Vital Signs BP 127/64   Pulse 78   Temp 98.4 F (36.9 C) (Oral)   Resp 16   Ht 5\' 7"  (1.702 m)   Wt 105.7 kg   LMP 11/27/2019 (Exact Date)   SpO2 100%   BMI  36.49 kg/m   Physical Exam Vitals and nursing note reviewed.  Constitutional:      Appearance: She is well-developed.  HENT:     Head: Normocephalic and atraumatic.     Mouth/Throat:     Mouth: Mucous membranes are moist.     Pharynx: Oropharynx is clear.  Eyes:     Pupils: Pupils are equal, round, and reactive to light.  Cardiovascular:     Rate and Rhythm: Normal rate and regular rhythm.  Pulmonary:     Effort: No respiratory distress.     Breath sounds: No stridor.  Abdominal:     General: There is no distension.  Musculoskeletal:        General: No swelling or tenderness. Normal range of motion.     Cervical back: Normal range of motion.  Skin:    General: Skin is warm and dry.  Neurological:     General: No focal deficit present.     Mental Status: She is alert.     ED Results / Procedures / Treatments   Labs (all labs ordered are listed, but only abnormal results are displayed) Labs Reviewed  CBC WITH DIFFERENTIAL/PLATELET - Abnormal; Notable for the following components:      Result Value   RBC 3.48 (*)    Hemoglobin 11.1 (*)    HCT 34.2 (*)    All other components within normal limits  COMPREHENSIVE METABOLIC PANEL - Abnormal; Notable for the following components:   Albumin 2.9 (*)    All other components within normal limits  RESPIRATORY PANEL BY RT PCR (FLU A&B, COVID)  URINALYSIS, ROUTINE W REFLEX MICROSCOPIC  FIBRINOGEN  APTT  PROTIME-INR  ABO/RH    EKG None  Radiology No results found.  Procedures Procedures (including critical care time)  Medications Ordered in ED Medications  lactated ringers bolus 1,000 mL (1,000 mLs Intravenous New Bag/Given 06/09/20 0523)    ED Course  I have reviewed the triage vital signs and the nursing notes.  Pertinent labs & imaging results that were available during my care of the patient were reviewed by me and considered in my medical decision making (see chart for details).    MDM  Rules/Calculators/A&P                          During evaluation for vaginal bleeding, while on tocometry and FHR, rapid Ob asked for transfer to MAU. I discussed with Dr. Elgie Congo who asked to add on a acouple labs which I did. Patient will be transferred for  further management.   Final Clinical Impression(s) / ED Diagnoses Final diagnoses:  Vaginal bleeding    Rx / DC Orders ED Discharge Orders    None       Thalia Turkington, [name], MD 06/09/20 [phone]

## 2020-06-09 NOTE — MAU Provider Note (Addendum)
History     CSN: [id number]  Arrival date and time: 06/09/20 7322   First Provider Initiated Contact with Patient 06/09/20 [phone]      Chief Complaint  Patient presents with  . Vaginal Bleeding   Ms. Stefanie Hale is a 39 y.o. year old G64P2002 female at [redacted]w[redacted]d weeks gestation who was sent to MAU from [facility] with complaints of VB since @ 0400. She woke up with abdominal cramping around 0420.    OB History    Gravida  3   Para  2   Term  2   Preterm      AB      Living  2     SAB      TAB      Ectopic      Multiple      Live Births  2           Past Medical History:  Diagnosis Date  . Anemia   . Childhood asthma    no inhaler,  no problems as adult  . Complication of anesthesia   . SVD (spontaneous vaginal delivery)    x 2    Past Surgical History:  Procedure Laterality Date  . HYSTEROSCOPY WITH D & C Bilateral 01/02/2018   Procedure: DILATATION AND CURETTAGE /HYSTEROSCOPY MYOSURE MYOMECTOMY AND NOVASURE ENDOMETRIAL ABLATION;  Surgeon: Lavonia Drafts, MD;  Location: [facility];  Service: Gynecology;  Laterality: Bilateral;  . TONSILLECTOMY      Family History  Problem Relation Age of Onset  . Arthritis Other   . Hypertension Other   . Stroke Other   . Lupus Mother   . Congestive Heart Failure Mother   . Cancer Mother   . Hypertension Mother   . Diabetes Mother     Social History   Tobacco Use  . Smoking status: Never Smoker  . Smokeless tobacco: Never Used  Vaping Use  . Vaping Use: Never used  Substance Use Topics  . Alcohol use: Never  . Drug use: No    Allergies:  Allergies  Allergen Reactions  . Influenza Vaccines Other (See Comments)    Arm swelling and felt very bad.  . Penicillins     REACTION: hives    No medications prior to admission.    Review of Systems  Constitutional: Negative.   HENT: Negative.   Eyes: Negative.   Respiratory: Negative.   Cardiovascular: Negative.     Gastrointestinal: Negative.   Endocrine: Negative.   Genitourinary: Positive for pelvic pain (cramping) and vaginal bleeding.  Musculoskeletal: Negative.   Skin: Negative.   Allergic/Immunologic: Negative.   Neurological: Negative.   Hematological: Negative.   Psychiatric/Behavioral: Negative.    Physical Exam   Blood pressure 113/62, pulse 70, temperature 98 F (36.7 C), resp. rate 16, height 5\' 7"  (1.702 m), weight 105.7 kg, last menstrual period 11/27/2019, SpO2 100 %.  Physical Exam Vitals and nursing note reviewed. Exam conducted with a chaperone present.  Constitutional:      Appearance: Normal appearance. She is obese.  HENT:     Head: Normocephalic and atraumatic.     Nose: Nose normal.  Eyes:     Pupils: Pupils are equal, round, and reactive to light.  Cardiovascular:     Rate and Rhythm: Normal rate.  Pulmonary:     Effort: Pulmonary effort is normal.  Abdominal:     Palpations: Abdomen is soft.  Genitourinary:       Comments: Uterus: gravid, S=D,  SE: cervix is smooth, pink, no lesions, small amt of thick tannish-brown vaginal d/c cleared away with 2 large cotton swabs -- WP, GC/CT done, closed/long/firm, mild CMT, (+) friability - active BRB oozing all around cervical os with touching with large cotton swabs, no adnexal tenderness  Musculoskeletal:        General: Normal range of motion.     Cervical back: Normal range of motion.  Skin:    General: Skin is warm and dry.  Neurological:     Mental Status: She is alert and oriented to person, place, and time.  Psychiatric:        Mood and Affect: Mood normal.        Behavior: Behavior normal.        Thought Content: Thought content normal.        Judgment: Judgment normal.   EFM: 140 bpm, mod variability, + accels, no decels Toco: none  Results for orders placed or performed during the hospital encounter of 06/09/20 (from the past 24 hour(s))  ABO/Rh     Status: None   Collection Time: 06/09/20  5:10 AM   Result Value Ref Range   ABO/RH(D) B POS    No rh immune globuloin      NOT A RH IMMUNE GLOBULIN CANDIDATE, PT RH POSITIVE Performed at [facility] [address] [place], [place] [address]   CBC with Differential     Status: Abnormal   Collection Time: 06/09/20  5:14 AM  Result Value Ref Range   WBC 6.1 4.0 - 10.5 K/uL   RBC 3.48 (L) 3.87 - 5.11 MIL/uL   Hemoglobin 11.1 (L) 12.0 - 15.0 g/dL   HCT 34.2 (L) 36 - 46 %   MCV 98.3 80.0 - 100.0 fL   MCH 31.9 26.0 - 34.0 pg   MCHC 32.5 30.0 - 36.0 g/dL   RDW 12.9 11.5 - 15.5 %   Platelets 209 150 - 400 K/uL   nRBC 0.0 0.0 - 0.2 %   Neutrophils Relative % 64 %   Neutro Abs 3.9 1.7 - 7.7 K/uL   Lymphocytes Relative 26 %   Lymphs Abs 1.6 0.7 - 4.0 K/uL   Monocytes Relative 7 %   Monocytes Absolute 0.4 0 - 1 K/uL   Eosinophils Relative 2 %   Eosinophils Absolute 0.1 0 - 0 K/uL   Basophils Relative 1 %   Basophils Absolute 0.1 0 - 0 K/uL   Immature Granulocytes 0 %   Abs Immature Granulocytes 0.02 0.00 - 0.07 K/uL  Comprehensive metabolic panel     Status: Abnormal   Collection Time: 06/09/20  5:14 AM  Result Value Ref Range   Sodium 137 135 - 145 mmol/L   Potassium 3.7 3.5 - 5.1 mmol/L   Chloride 103 98 - 111 mmol/L   CO2 23 22 - 32 mmol/L   Glucose, Bld 84 70 - 99 mg/dL   BUN 14 6 - 20 mg/dL   Creatinine, Ser 0.62 0.44 - 1.00 mg/dL   Calcium 8.9 8.9 - 10.3 mg/dL   Total Protein 6.5 6.5 - 8.1 g/dL   Albumin 2.9 (L) 3.5 - 5.0 g/dL   AST 18 15 - 41 U/L   ALT 18 0 - 44 U/L   Alkaline Phosphatase 49 38 - 126 U/L   Total Bilirubin 0.3 0.3 - 1.2 mg/dL   GFR calc non Af Amer >60 >60 mL/min   GFR calc Af Amer >60 >60 mL/min   Anion  gap 11 5 - 15  Fibrinogen     Status: None   Collection Time: 06/09/20  5:40 AM  Result Value Ref Range   Fibrinogen 400 210 - 475 mg/dL  APTT     Status: None   Collection Time: 06/09/20  5:40 AM  Result Value Ref Range   aPTT 27 24 - 36 seconds  Protime-INR     Status: None    Collection Time: 06/09/20  5:40 AM  Result Value Ref Range   Prothrombin Time 13.2 11.4 - 15.2 seconds   INR 1.0 0.8 - 1.2  Respiratory Panel by RT PCR (Flu A&B, Covid) - Nasopharyngeal Swab     Status: None   Collection Time: 06/09/20  5:40 AM   Specimen: Nasopharyngeal Swab  Result Value Ref Range   SARS Coronavirus 2 by RT PCR NEGATIVE NEGATIVE   Influenza A by PCR NEGATIVE NEGATIVE   Influenza B by PCR NEGATIVE NEGATIVE  Wet prep, genital     Status: Abnormal   Collection Time: 06/09/20  8:17 AM   Specimen: Cervix  Result Value Ref Range   Yeast Wet Prep HPF POC NONE SEEN NONE SEEN   Trich, Wet Prep NONE SEEN NONE SEEN   Clue Cells Wet Prep HPF POC PRESENT (A) NONE SEEN   WBC, Wet Prep HPF POC MODERATE (A) NONE SEEN   Sperm NONE SEEN   Urinalysis, Routine w reflex microscopic     Status: Abnormal   Collection Time: 06/09/20  8:19 AM  Result Value Ref Range   Color, Urine YELLOW YELLOW   APPearance CLEAR CLEAR   Specific Gravity, Urine 1.015 1.005 - 1.030   pH 5.0 5.0 - 8.0   Glucose, UA NEGATIVE NEGATIVE mg/dL   Hgb urine dipstick SMALL (A) NEGATIVE   Bilirubin Urine NEGATIVE NEGATIVE   Ketones, ur NEGATIVE NEGATIVE mg/dL   Protein, ur NEGATIVE NEGATIVE mg/dL   Nitrite NEGATIVE NEGATIVE   Leukocytes,Ua NEGATIVE NEGATIVE   RBC / HPF 0-5 0 - 5 RBC/hpf   WBC, UA 0-5 0 - 5 WBC/hpf   Bacteria, UA RARE (A) NONE SEEN   Squamous Epithelial / LPF 0-5 0 - 5   Mucus PRESENT    MAU Course  Procedures  MDM Wet Prep GC/CT -- Results pending  Most of labs below were ordered at [facility]  Report given to and care assumed by Julianne Handler, CNM @ 0845  Laury Deep, MSN, CNM 06/09/2020, 8:07 AM   7510: Reassessment: no further VB, only when she wiped after giving urine sample. Likely cervicitis, GC pending. Stable for discharge home.   Assessment and Plan   1. Vaginal bleeding   2. [redacted] weeks gestation of pregnancy   3. Blood type, Rh positive   4. Cervicitis     Discharge home Follow up at [facility] as scheduled PTL precautions Pelvic rest  Allergies as of 06/09/2020      Reactions   Influenza Vaccines Other (See Comments)   Arm swelling and felt very bad.   Penicillins    REACTION: hives      Medication List    TAKE these medications   aspirin EC 81 MG tablet Take 81 mg by mouth daily. Swallow whole.   ondansetron 4 MG disintegrating tablet Commonly known as: Zofran ODT Take 1 tablet (4 mg total) by mouth every 6 (six) hours as needed for nausea.   PRENATAL VITAMINS PO Take by mouth.       Julianne Handler, CNM  06/09/2020 10:50 AM

## 2020-06-09 NOTE — Progress Notes (Signed)
Dr Elgie Congo notified of patient presentation to [facility], advised that patient is a 3/2 at 27.[redacted] weeks along who presents due to bright red vaginal bleeding. Uneventful pregnancy noted up until this point. Orders given to have patient transferred to MAU at this time for further assessment. Shannan Harper, RN MAU charge notified of patient transfer.

## 2020-06-09 NOTE — Progress Notes (Signed)
RROB notified of patient who presents to [facility] with complaints of bright red vaginal bleeding. Patient verbalized to ED staff that it was noticed when she went to void and wiped. Patient is a G3P2 at 27.[redacted] weeks along in this pregnancy. Pregnancy has been uneventful up to this point. Patient denies contraction pain or recent intercourse.  EFM applied and assessing.

## 2020-06-09 NOTE — MAU Note (Signed)
Pt arrives via [facility] from [facility]. Presented there with complaint of vaginal bleeding since 4 am.abd cramping

## 2020-06-09 NOTE — ED Triage Notes (Addendum)
Pc/o lower abd cramping with bright red vaginal bleeding that started 30 min PTA. Pt is [redacted] weeks pregnant

## 2020-06-09 NOTE — ED Notes (Signed)
OB rapid response called. Pt placed on external fetal monitor.

## 2020-06-09 NOTE — Discharge Instructions (Signed)
Vaginal Bleeding During Pregnancy, Second Trimester  A small amount of bleeding (spotting) from the vagina is relatively common during pregnancy. It usually stops on its own. Various things can cause spotting during pregnancy. Sometimes the bleeding is normal and is not a sign of a problem in the pregnancy. However, bleeding can also be a sign of something serious. Be sure to tell your health care provider about any vaginal bleeding right away. Some possible causes of vaginal bleeding during the second trimester include:  Infection, inflammation, or growths (polyps) on the cervix.  A condition in which the placenta partially or completely covers the opening of the cervix inside the uterus (placenta previa).  The placenta separating from the uterus (placenta abruption).  Early (preterm) labor.  The cervix opening and thinning before pregnancy is at term and before labor starts (cervical insufficiency).  A mass of tissue developing in the uterus due to an egg being fertilized incorrectly (molar pregnancy). Follow these instructions at home: Activity  Follow instructions from your health care provider about limiting your activity. Ask what activities are safe for you.  If needed, make plans for someone to help with your regular activities.  Do not exercise or do activities that take a lot of effort unless your health care provider approves.  Do not lift anything that is heavier than 10 lb (4.5 kg), or the limit that your health care provider tells you, until he or she says that it is safe.  Do not have sex or orgasms until your health care provider says that this is safe. Medicines  Take over-the-counter and prescription medicines only as told by your health care provider.  Do not take aspirin because it can cause bleeding. General instructions  Pay attention to any changes in your symptoms.  Write down how many pads you use each day, how often you change pads, and how soaked  (saturated) they are.  Do not use tampons or douche.  If you pass any tissue from your vagina, save the tissue so you can show it to your health care provider.  Keep all follow-up visits as told by your health care provider. This is important. Contact a health care provider if:  You have vaginal bleeding during any time of your pregnancy.  You have cramps or labor pains.  You have a fever that does not get better when you take medicines. Get help right away if:  You have severe cramps in your back or abdomen.  You have contractions.  You have chills.  You pass large clots or a large amount of tissue from your vagina.  Your bleeding increases.  You feel light-headed or weak, or you faint.  You are leaking fluid or have a gush of fluid from your vagina. Summary  Various things can cause bleeding or spotting in pregnancy.  Be sure to tell your health care provider about any vaginal bleeding right away.  Follow instructions from your health care provider about limiting your activity. Ask what activities are safe for you. This information is not intended to replace advice given to you by your health care provider. Make sure you discuss any questions you have with your health care provider. Document Revised: 12/18/2018 Document Reviewed: 12/01/2016 Elsevier Patient Education  [place].    Abdominal Pain During Pregnancy  Belly (abdominal) pain is common during pregnancy. There are many possible causes. Most of the time, it is not a serious problem. Other times, it can be a sign that something is  wrong with the pregnancy. Always tell your doctor if you have belly pain. Follow these instructions at home:  Do not have sex or put anything in your vagina until your pain goes away completely.  Get plenty of rest until your pain gets better.  Drink enough fluid to keep your pee (urine) pale yellow.  Take over-the-counter and prescription medicines only as told by  your doctor.  Keep all follow-up visits as told by your doctor. This is important. Contact a doctor if:  Your pain continues or gets worse after resting.  You have lower belly pain that: ? Comes and goes at regular times. ? Spreads to your back. ? Feels like menstrual cramps.  You have pain or burning when you pee (urinate). Get help right away if:  You have a fever or chills.  You have vaginal bleeding.  You are leaking fluid from your vagina.  You are passing tissue from your vagina.  You throw up (vomit) for more than 24 hours.  You have watery poop (diarrhea) for more than 24 hours.  Your baby is moving less than usual.  You feel very weak or faint.  You have shortness of breath.  You have very bad pain in your upper belly. Summary  Belly (abdominal) pain is common during pregnancy. There are many possible causes.  If you have belly pain during pregnancy, tell your doctor right away.  Keep all follow-up visits as told by your doctor. This is important. This information is not intended to replace advice given to you by your health care provider. Make sure you discuss any questions you have with your health care provider. Document Revised: 12/17/2018 Document Reviewed: 12/01/2016 Elsevier Patient Education  [place].

## 2020-06-10 LAB — GC/CHLAMYDIA PROBE AMP (~~LOC~~) NOT AT ARMC
Chlamydia: NEGATIVE
Comment: NEGATIVE
Comment: NORMAL
Neisseria Gonorrhea: NEGATIVE

## 2020-06-15 ENCOUNTER — Ambulatory Visit: Payer: BC Managed Care – PPO | Admitting: *Deleted

## 2020-06-15 ENCOUNTER — Encounter: Payer: Self-pay | Admitting: *Deleted

## 2020-06-15 ENCOUNTER — Other Ambulatory Visit: Payer: Self-pay

## 2020-06-15 ENCOUNTER — Other Ambulatory Visit: Payer: Self-pay | Admitting: *Deleted

## 2020-06-15 ENCOUNTER — Ambulatory Visit: Payer: BC Managed Care – PPO | Attending: Obstetrics and Gynecology

## 2020-06-15 DIAGNOSIS — O3413 Maternal care for benign tumor of corpus uteri, third trimester: Secondary | ICD-10-CM | POA: Diagnosis not present

## 2020-06-15 DIAGNOSIS — O322XX0 Maternal care for transverse and oblique lie, not applicable or unspecified: Secondary | ICD-10-CM

## 2020-06-15 DIAGNOSIS — O99210 Obesity complicating pregnancy, unspecified trimester: Secondary | ICD-10-CM | POA: Diagnosis present

## 2020-06-15 DIAGNOSIS — O0990 Supervision of high risk pregnancy, unspecified, unspecified trimester: Secondary | ICD-10-CM | POA: Diagnosis present

## 2020-06-15 DIAGNOSIS — Z2839 Other underimmunization status: Secondary | ICD-10-CM

## 2020-06-15 DIAGNOSIS — Z362 Encounter for other antenatal screening follow-up: Secondary | ICD-10-CM

## 2020-06-15 DIAGNOSIS — O3440 Maternal care for other abnormalities of cervix, unspecified trimester: Secondary | ICD-10-CM

## 2020-06-15 DIAGNOSIS — O3429 Maternal care due to uterine scar from other previous surgery: Secondary | ICD-10-CM | POA: Diagnosis not present

## 2020-06-15 DIAGNOSIS — Z283 Underimmunization status: Secondary | ICD-10-CM | POA: Diagnosis present

## 2020-06-15 DIAGNOSIS — Z3A28 28 weeks gestation of pregnancy: Secondary | ICD-10-CM

## 2020-06-15 DIAGNOSIS — O09523 Supervision of elderly multigravida, third trimester: Secondary | ICD-10-CM | POA: Diagnosis not present

## 2020-06-15 DIAGNOSIS — Z9889 Other specified postprocedural states: Secondary | ICD-10-CM | POA: Insufficient documentation

## 2020-06-15 DIAGNOSIS — O99891 Other specified diseases and conditions complicating pregnancy: Secondary | ICD-10-CM | POA: Insufficient documentation

## 2020-06-15 DIAGNOSIS — D259 Leiomyoma of uterus, unspecified: Secondary | ICD-10-CM

## 2020-06-23 ENCOUNTER — Ambulatory Visit (INDEPENDENT_AMBULATORY_CARE_PROVIDER_SITE_OTHER): Payer: BC Managed Care – PPO | Admitting: Advanced Practice Midwife

## 2020-06-23 ENCOUNTER — Other Ambulatory Visit: Payer: Self-pay

## 2020-06-23 ENCOUNTER — Encounter: Payer: Self-pay | Admitting: Advanced Practice Midwife

## 2020-06-23 VITALS — BP 105/67 | HR 85 | Wt 231.0 lb

## 2020-06-23 DIAGNOSIS — Z3A29 29 weeks gestation of pregnancy: Secondary | ICD-10-CM | POA: Diagnosis not present

## 2020-06-23 DIAGNOSIS — Z23 Encounter for immunization: Secondary | ICD-10-CM

## 2020-06-23 DIAGNOSIS — N939 Abnormal uterine and vaginal bleeding, unspecified: Secondary | ICD-10-CM | POA: Diagnosis not present

## 2020-06-23 NOTE — Progress Notes (Signed)
   PRENATAL VISIT NOTE  Subjective:  Stefanie Hale is a 39 y.o. [id number] at [redacted]w[redacted]d being seen today for ongoing prenatal care.  She is currently monitored for the following issues for this high-risk pregnancy and has SOMNOLENCE; EPISTAXIS, RECURRENT; Rash; Vaginal discharge; Anemia; Back pain; Irregular menses; Unspecified contraceptive management; Sinusitis; Medial tibial stress syndrome, right, initial encounter; Fibroids; Right knee injury, initial encounter; Supervision of high risk pregnancy, antepartum; AMA (advanced maternal age) multigravida 35+; Obesity affecting pregnancy, antepartum; History of surgery involving uterine cervix, antepartum; and Rubella non-immune status, antepartum on their problem list.  Patient reports bleeding and seen for this several days ago, friable cervix, just had spotting a few days ago.  Contractions: Not present. Vag. Bleeding: Scant.  Movement: Present. Denies leaking of fluid.   The following portions of the patient's history were reviewed and updated as appropriate: allergies, current medications, past family history, past medical history, past social history, past surgical history and problem list.   Objective:   Vitals:   06/23/20 0842  BP: 105/67  Pulse: 85  Weight: 231 lb (104.8 kg)    Fetal Status: Fetal Heart Rate (bpm): 142   Movement: Present     General:  Alert, oriented and cooperative. Patient is in no acute distress.  Skin: Skin is warm and dry. No rash noted.   Cardiovascular: Normal heart rate noted  Respiratory: Normal respiratory effort, no problems with respiration noted  Abdomen: Soft, gravid, appropriate for gestational age.  Pain/Pressure: Absent     Pelvic: Cervical exam deferred        Extremities: Normal range of motion.  Edema: None  Mental Status: Normal mood and affect. Normal behavior. Normal judgment and thought content.   Assessment and Plan:  Pregnancy: G3P2002 at [redacted]w[redacted]d 1. [redacted] weeks gestation of pregnancy  -  CBC - Glucose Tolerance, 2 Hours w/1 Hour - HIV Antibody (routine testing w rflx) - RPR - Tdap vaccine greater than or equal to 7yo IM  2. Vaginal bleeding Bleeding precautions Pelvic rest Has appt for [place] in few weeks for growth  - CBC - Glucose Tolerance, 2 Hours w/1 Hour - HIV Antibody (routine testing w rflx) - RPR - Tdap vaccine greater than or equal to 7yo IM  Preterm labor symptoms and general obstetric precautions including but not limited to vaginal bleeding, contractions, leaking of fluid and fetal movement were reviewed in detail with the patient. Please refer to After Visit Summary for other counseling recommendations.   Return in about 2 weeks (around 07/07/2020) for [facility].  Future Appointments  Date Time Provider [place]  08/10/2020 10:30 AM [facility] ([facility] NURSE [facility]  08/10/2020 10:45 AM [facility]    Hansel Feinstein, CNM

## 2020-06-23 NOTE — Patient Instructions (Signed)
Third Trimester of Pregnancy The third trimester is from week 28 through week 40 (months 7 through 9). The third trimester is a time when the unborn baby (fetus) is growing rapidly. At the end of the ninth month, the fetus is about 20 inches in length and weighs 6-10 pounds. Body changes during your third trimester Your body will continue to go through many changes during pregnancy. The changes vary from woman to woman. During the third trimester:  Your weight will continue to increase. You can expect to gain 25-35 pounds (11-16 kg) by the end of the pregnancy.  You may begin to get stretch marks on your hips, abdomen, and breasts.  You may urinate more often because the fetus is moving lower into your pelvis and pressing on your bladder.  You may develop or continue to have heartburn. This is caused by increased hormones that slow down muscles in the digestive tract.  You may develop or continue to have constipation because increased hormones slow digestion and cause the muscles that push waste through your intestines to relax.  You may develop hemorrhoids. These are swollen veins (varicose veins) in the rectum that can itch or be painful.  You may develop swollen, bulging veins (varicose veins) in your legs.  You may have increased body aches in the pelvis, back, or thighs. This is due to weight gain and increased hormones that are relaxing your joints.  You may have changes in your hair. These can include thickening of your hair, rapid growth, and changes in texture. Some women also have hair loss during or after pregnancy, or hair that feels dry or thin. Your hair will most likely return to normal after your baby is born.  Your breasts will continue to grow and they will continue to become tender. A yellow fluid (colostrum) may leak from your breasts. This is the first milk you are producing for your baby.  Your belly button may stick out.  You may notice more swelling in your hands,  face, or ankles.  You may have increased tingling or numbness in your hands, arms, and legs. The skin on your belly may also feel numb.  You may feel short of breath because of your expanding uterus.  You may have more problems sleeping. This can be caused by the size of your belly, increased need to urinate, and an increase in your body's metabolism.  You may notice the fetus "dropping," or moving lower in your abdomen (lightening).  You may have increased vaginal discharge.  You may notice your joints feel loose and you may have pain around your pelvic bone. What to expect at prenatal visits You will have prenatal exams every 2 weeks until week 36. Then you will have weekly prenatal exams. During a routine prenatal visit:  You will be weighed to make sure you and the baby are growing normally.  Your blood pressure will be taken.  Your abdomen will be measured to track your baby's growth.  The fetal heartbeat will be listened to.  Any test results from the previous visit will be discussed.  You may have a cervical check near your due date to see if your cervix has softened or thinned (effaced).  You will be tested for Group B streptococcus. This happens between 35 and 37 weeks. Your health care provider may ask you:  What your birth plan is.  How you are feeling.  If you are feeling the baby move.  If you have had any abnormal   symptoms, such as leaking fluid, bleeding, severe headaches, or abdominal cramping.  If you are using any tobacco products, including cigarettes, chewing tobacco, and electronic cigarettes.  If you have any questions. Other tests or screenings that may be performed during your third trimester include:  Blood tests that check for low iron levels (anemia).  Fetal testing to check the health, activity level, and growth of the fetus. Testing is done if you have certain medical conditions or if there are problems during the pregnancy.  Nonstress test  (NST). This test checks the health of your baby to make sure there are no signs of problems, such as the baby not getting enough oxygen. During this test, a belt is placed around your belly. The baby is made to move, and its heart rate is monitored during movement. What is false labor? False labor is a condition in which you feel small, irregular tightenings of the muscles in the womb (contractions) that usually go away with rest, changing position, or drinking water. These are called Braxton Hicks contractions. Contractions may last for hours, days, or even weeks before true labor sets in. If contractions come at regular intervals, become more frequent, increase in intensity, or become painful, you should see your health care provider. What are the signs of labor?  Abdominal cramps.  Regular contractions that start at 10 minutes apart and become stronger and more frequent with time.  Contractions that start on the top of the uterus and spread down to the lower abdomen and back.  Increased pelvic pressure and dull back pain.  A watery or bloody mucus discharge that comes from the vagina.  Leaking of amniotic fluid. This is also known as your "water breaking." It could be a slow trickle or a gush. Let your health care provider know if it has a color or strange odor. If you have any of these signs, call your health care provider right away, even if it is before your due date. Follow these instructions at home: Medicines  Follow your health care provider's instructions regarding medicine use. Specific medicines may be either safe or unsafe to take during pregnancy.  Take a prenatal vitamin that contains at least 600 micrograms (mcg) of folic acid.  If you develop constipation, try taking a stool softener if your health care provider approves. Eating and drinking   Eat a balanced diet that includes fresh fruits and vegetables, whole grains, good sources of protein such as meat, eggs, or tofu,  and low-fat dairy. Your health care provider will help you determine the amount of weight gain that is right for you.  Avoid raw meat and uncooked cheese. These carry germs that can cause birth defects in the baby.  If you have low calcium intake from food, talk to your health care provider about whether you should take a daily calcium supplement.  Eat four or five small meals rather than three large meals a day.  Limit foods that are high in fat and processed sugars, such as fried and sweet foods.  To prevent constipation: ? Drink enough fluid to keep your urine clear or pale yellow. ? Eat foods that are high in fiber, such as fresh fruits and vegetables, whole grains, and beans. Activity  Exercise only as directed by your health care provider. Most women can continue their usual exercise routine during pregnancy. Try to exercise for 30 minutes at least 5 days a week. Stop exercising if you experience uterine contractions.  Avoid heavy lifting.  Do   not exercise in extreme heat or humidity, or at high altitudes.  Wear low-heel, comfortable shoes.  Practice good posture.  You may continue to have sex unless your health care provider tells you otherwise. Relieving pain and discomfort  Take frequent breaks and rest with your legs elevated if you have leg cramps or low back pain.  Take warm sitz baths to soothe any pain or discomfort caused by hemorrhoids. Use hemorrhoid cream if your health care provider approves.  Wear a good support bra to prevent discomfort from breast tenderness.  If you develop varicose veins: ? Wear support pantyhose or compression stockings as told by your healthcare provider. ? Elevate your feet for 15 minutes, 3-4 times a day. Prenatal care  Write down your questions. Take them to your prenatal visits.  Keep all your prenatal visits as told by your health care provider. This is important. Safety  Wear your seat belt at all times when driving.  Make  a list of emergency phone numbers, including numbers for family, friends, the hospital, and police and fire departments. General instructions  Avoid cat litter boxes and soil used by cats. These carry germs that can cause birth defects in the baby. If you have a cat, ask someone to clean the litter box for you.  Do not travel far distances unless it is absolutely necessary and only with the approval of your health care provider.  Do not use hot tubs, steam rooms, or saunas.  Do not drink alcohol.  Do not use any products that contain nicotine or tobacco, such as cigarettes and e-cigarettes. If you need help quitting, ask your health care provider.  Do not use any medicinal herbs or unprescribed drugs. These chemicals affect the formation and growth of the baby.  Do not douche or use tampons or scented sanitary pads.  Do not cross your legs for long periods of time.  To prepare for the arrival of your baby: ? Take prenatal classes to understand, practice, and ask questions about labor and delivery. ? Make a trial run to the hospital. ? Visit the hospital and tour the maternity area. ? Arrange for maternity or paternity leave through employers. ? Arrange for family and friends to take care of pets while you are in the hospital. ? Purchase a rear-facing car seat and make sure you know how to install it in your car. ? Pack your hospital bag. ? Prepare the baby's nursery. Make sure to remove all pillows and stuffed animals from the baby's crib to prevent suffocation.  Visit your dentist if you have not gone during your pregnancy. Use a soft toothbrush to brush your teeth and be gentle when you floss. Contact a health care provider if:  You are unsure if you are in labor or if your water has broken.  You become dizzy.  You have mild pelvic cramps, pelvic pressure, or nagging pain in your abdominal area.  You have lower back pain.  You have persistent nausea, vomiting, or  diarrhea.  You have an unusual or bad smelling vaginal discharge.  You have pain when you urinate. Get help right away if:  Your water breaks before 37 weeks.  You have regular contractions less than 5 minutes apart before 37 weeks.  You have a fever.  You are leaking fluid from your vagina.  You have spotting or bleeding from your vagina.  You have severe abdominal pain or cramping.  You have rapid weight loss or weight gain.  You have   shortness of breath with chest pain.  You notice sudden or extreme swelling of your face, hands, ankles, feet, or legs.  Your baby makes fewer than 10 movements in 2 hours.  You have severe headaches that do not go away when you take medicine.  You have vision changes. Summary  The third trimester is from week 28 through week 40, months 7 through 9. The third trimester is a time when the unborn baby (fetus) is growing rapidly.  During the third trimester, your discomfort may increase as you and your baby continue to gain weight. You may have abdominal, leg, and back pain, sleeping problems, and an increased need to urinate.  During the third trimester your breasts will keep growing and they will continue to become tender. A yellow fluid (colostrum) may leak from your breasts. This is the first milk you are producing for your baby.  False labor is a condition in which you feel small, irregular tightenings of the muscles in the womb (contractions) that eventually go away. These are called Braxton Hicks contractions. Contractions may last for hours, days, or even weeks before true labor sets in.  Signs of labor can include: abdominal cramps; regular contractions that start at 10 minutes apart and become stronger and more frequent with time; watery or bloody mucus discharge that comes from the vagina; increased pelvic pressure and dull back pain; and leaking of amniotic fluid. This information is not intended to replace advice given to you by your  health care provider. Make sure you discuss any questions you have with your health care provider. Document Revised: 12/20/2018 Document Reviewed: 10/04/2016 Elsevier Patient Education  2020 Elsevier Inc.  

## 2020-06-24 LAB — CBC
Hematocrit: 34.5 % (ref 34.0–46.6)
Hemoglobin: 12.1 g/dL (ref 11.1–15.9)
MCH: 32.4 pg (ref 26.6–33.0)
MCHC: 35.1 g/dL (ref 31.5–35.7)
MCV: 93 fL (ref 79–97)
Platelets: 230 10*3/uL (ref 150–450)
RBC: 3.73 x10E6/uL — ABNORMAL LOW (ref 3.77–5.28)
RDW: 12 % (ref 11.7–15.4)
WBC: 6.1 10*3/uL (ref 3.4–10.8)

## 2020-06-24 LAB — HIV ANTIBODY (ROUTINE TESTING W REFLEX): HIV Screen 4th Generation wRfx: NONREACTIVE

## 2020-06-24 LAB — RPR: RPR Ser Ql: NONREACTIVE

## 2020-06-24 LAB — GLUCOSE TOLERANCE, 2 HOURS W/ 1HR
Glucose, 1 hour: 91 mg/dL (ref 65–179)
Glucose, 2 hour: 73 mg/dL (ref 65–152)
Glucose, Fasting: 79 mg/dL (ref 65–91)

## 2020-06-26 ENCOUNTER — Ambulatory Visit (INDEPENDENT_AMBULATORY_CARE_PROVIDER_SITE_OTHER): Payer: BC Managed Care – PPO | Admitting: Obstetrics & Gynecology

## 2020-06-26 ENCOUNTER — Encounter: Payer: Self-pay | Admitting: Obstetrics & Gynecology

## 2020-06-26 ENCOUNTER — Other Ambulatory Visit (HOSPITAL_COMMUNITY)
Admission: RE | Admit: 2020-06-26 | Discharge: 2020-06-26 | Disposition: A | Discharge status: Home / Self Care | Payer: BC Managed Care – PPO | Source: Ambulatory Visit | Attending: Obstetrics & Gynecology | Admitting: Obstetrics & Gynecology

## 2020-06-26 ENCOUNTER — Other Ambulatory Visit: Payer: Self-pay

## 2020-06-26 VITALS — BP 100/67 | HR 93 | Wt 230.0 lb

## 2020-06-26 DIAGNOSIS — Z3A30 30 weeks gestation of pregnancy: Secondary | ICD-10-CM | POA: Insufficient documentation

## 2020-06-26 DIAGNOSIS — B9689 Other specified bacterial agents as the cause of diseases classified elsewhere: Secondary | ICD-10-CM | POA: Insufficient documentation

## 2020-06-26 DIAGNOSIS — B373 Candidiasis of vulva and vagina: Secondary | ICD-10-CM | POA: Insufficient documentation

## 2020-06-26 DIAGNOSIS — O23593 Infection of other part of genital tract in pregnancy, third trimester: Secondary | ICD-10-CM | POA: Diagnosis not present

## 2020-06-26 DIAGNOSIS — O0993 Supervision of high risk pregnancy, unspecified, third trimester: Secondary | ICD-10-CM | POA: Diagnosis not present

## 2020-06-26 DIAGNOSIS — O0990 Supervision of high risk pregnancy, unspecified, unspecified trimester: Secondary | ICD-10-CM

## 2020-06-26 DIAGNOSIS — O3440 Maternal care for other abnormalities of cervix, unspecified trimester: Secondary | ICD-10-CM

## 2020-06-26 DIAGNOSIS — O99210 Obesity complicating pregnancy, unspecified trimester: Secondary | ICD-10-CM

## 2020-06-26 DIAGNOSIS — O09523 Supervision of elderly multigravida, third trimester: Secondary | ICD-10-CM

## 2020-06-26 DIAGNOSIS — O09899 Supervision of other high risk pregnancies, unspecified trimester: Secondary | ICD-10-CM

## 2020-06-26 DIAGNOSIS — D219 Benign neoplasm of connective and other soft tissue, unspecified: Secondary | ICD-10-CM

## 2020-06-26 DIAGNOSIS — O99891 Other specified diseases and conditions complicating pregnancy: Secondary | ICD-10-CM

## 2020-06-26 DIAGNOSIS — Z283 Underimmunization status: Secondary | ICD-10-CM

## 2020-06-26 DIAGNOSIS — B3731 Acute candidiasis of vulva and vagina: Secondary | ICD-10-CM

## 2020-06-26 MED ORDER — TERCONAZOLE 0.4 % VA CREA: 1.0000 | TOPICAL_CREAM | Freq: Every day | VAGINAL | 0 refills | Status: DC

## 2020-06-26 NOTE — Addendum Note (Signed)
Addended by: Valentina Lucks on: 06/26/2020 09:16 AM   Modules accepted: Orders

## 2020-06-26 NOTE — Progress Notes (Signed)
   PRENATAL VISIT NOTE  Subjective:  Stefanie Hale is a 39 y.o. G3P2002 at [redacted]w[redacted]d being seen today for ongoing prenatal care.  She is currently monitored for the following issues for this high-risk pregnancy and has SOMNOLENCE; EPISTAXIS, RECURRENT; Rash; Vaginal discharge; Anemia; Back pain; Irregular menses; Unspecified contraceptive management; Sinusitis; Medial tibial stress syndrome, right, initial encounter; Fibroids; Right knee injury, initial encounter; Supervision of high risk pregnancy, antepartum; AMA (advanced maternal age) multigravida 35+; Obesity affecting pregnancy, antepartum; History of surgery involving uterine cervix, antepartum; and Rubella non-immune status, antepartum on their problem list.  Patient reports bleeding.Pt presents with spotting. She has noted some bright red blood but, only after wiping.  Contractions: Not present. Vag. Bleeding: Small.  Movement: Present. Denies leaking of fluid.   The following portions of the patient's history were reviewed and updated as appropriate: allergies, current medications, past family history, past medical history, past social history, past surgical history and problem list.   Objective:   Vitals:   06/26/20 0825  BP: 100/67  Pulse: 93  Weight: 230 lb (104.3 kg)    Fetal Status: Fetal Heart Rate (bpm): 149   Movement: Present     General:  Alert, oriented and cooperative. Patient is in no acute distress.  Skin: Skin is warm and dry. No rash noted.   Cardiovascular: Normal heart rate noted  Respiratory: Normal respiratory effort, no problems with respiration noted  Abdomen: Soft, gravid, appropriate for gestational age.  Pain/Pressure: Present     Pelvic: Cervical exam performed in the presence of a chaperone        Extremities: Normal range of motion.  Edema: None  Mental Status: Normal mood and affect. Normal behavior. Normal judgment and thought content.   Assessment and Plan:  Pregnancy: G3P2002 at [redacted]w[redacted]d 1.  Supervision of high risk pregnancy, antepartum Exam consistent with yeast. Affirm sent for confirmation. Will treat with Terazol in the meantime.   2. Rubella non-immune status, antepartum MMR PP  3. Multigravida of advanced maternal age in third trimester  4. Obesity affecting pregnancy, antepartum  5. History of surgery involving uterine cervix, antepartum Pt is s/p endometrial ablation 06/15/2020 This patient was seen for a follow up growth scan due to a  fibroid uterus and history of endometrial at ablation.  The fetal growth and amniotic fluid level appears appropriate for  her gestational age.  A normal-appearing anterior placenta continues to be noted  today.  The suspicion for placenta accreta continues to be low.  Due to her history, a follow-up growth scan was scheduled in 8  weeks.  This exam was read remotely.  6. Fibroids   Preterm labor symptoms and general obstetric precautions including but not limited to vaginal bleeding, contractions, leaking of fluid and fetal movement were reviewed in detail with the patient. Please refer to After Visit Summary for other counseling recommendations.   No follow-ups on file.  Future Appointments  Date Time Provider [place]  07/07/2020 10:15 AM Seabron Spates, CNM [facility] None  08/10/2020 10:30 AM [facility] NURSE [facility]  08/10/2020 10:45 AM [facility] US4 [facility]    Lavonia Drafts, MD

## 2020-06-26 NOTE — Patient Instructions (Signed)
Vaginal Bleeding During Pregnancy, Third Trimester  A small amount of bleeding from the vagina (spotting) is relatively common during pregnancy. Various things can cause bleeding or spotting during pregnancy. Sometimes bleeding is normal and is not a problem. However, bleeding during the third trimester can also be a sign of something serious for the mother and the baby. Be sure to tell your health care provider about any vaginal bleeding right away. Some possible causes of vaginal bleeding during the third trimester include:  Infection or growths (polyps) on the cervix.  A condition in which the placenta partially or completely covers the opening of the cervix inside the uterus (placenta previa).  The placenta separating from the uterus (placenta abruption).  The start of labor (discharging of the mucus plug).  A condition in which the placenta grows into the muscle layer of the uterus (placenta accreta). Follow these instructions at home: Activity  Follow instructions from your health care provider about limiting your activity. If your health care provider recommends activity restriction, you may need to stay in bed and only get up to use the bathroom. In some cases, your health care provider may allow you to continue light activity.  If needed, make plans for someone to help with your regular activities.  Ask your health care provider if it is safe for you to drive.  Do not lift anything that is heavier than 10 lb (4.5 kg), or the limit that your health care provider tells you, until he or she says that it is safe.  Do not have sex or orgasms until your health care provider says that this is safe. Medicines  Take over-the-counter and prescription medicines only as told by your health care provider.  Do not take aspirin because it can cause bleeding. General instructions  Pay attention to any changes in your symptoms.  Write down how many pads you use each day, how often you  change pads, and how soaked (saturated) they are.  Do not use tampons or douche.  If you pass any tissue from your vagina, save the tissue so you can show it to your health care provider.  Keep all follow-up visits as told by your health care provider. This is important. Contact a health care provider if:  You have vaginal bleeding during any part of your pregnancy.  You have cramps or labor pains.  You have a fever. Get help right away if:  You have severe cramps or pain in your back or abdomen.  You have a gush of fluid from the vagina.  You pass large clots or a large amount of tissue from your vagina.  Your bleeding increases.  You feel light-headed or weak.  You faint.  You feel that your baby is moving less than usual, or not moving at all. Summary  Various things can cause bleeding or spotting in pregnancy.  Bleeding during the third trimester can be a sign of a serious problem for the mother and the baby.  Be sure to tell your health care provider about any vaginal bleeding right away. This information is not intended to replace advice given to you by your health care provider. Make sure you discuss any questions you have with your health care provider. Document Revised: 12/18/2018 Document Reviewed: 12/01/2016 Elsevier Patient Education  2020 Elsevier Inc.  

## 2020-06-29 LAB — CERVICOVAGINAL ANCILLARY ONLY
Bacterial Vaginitis (gardnerella): POSITIVE — AB
Candida Glabrata: NEGATIVE
Candida Vaginitis: POSITIVE — AB
Comment: NEGATIVE
Comment: NEGATIVE
Comment: NEGATIVE

## 2020-06-30 ENCOUNTER — Other Ambulatory Visit: Payer: Self-pay

## 2020-06-30 DIAGNOSIS — B9689 Other specified bacterial agents as the cause of diseases classified elsewhere: Secondary | ICD-10-CM

## 2020-06-30 MED ORDER — METRONIDAZOLE 500 MG PO TABS: 500.0000 mg | ORAL_TABLET | Freq: Two times a day (BID) | ORAL | 0 refills | Status: DC

## 2020-07-07 ENCOUNTER — Other Ambulatory Visit: Payer: Self-pay

## 2020-07-07 ENCOUNTER — Ambulatory Visit (INDEPENDENT_AMBULATORY_CARE_PROVIDER_SITE_OTHER): Payer: BC Managed Care – PPO | Admitting: Advanced Practice Midwife

## 2020-07-07 VITALS — BP 109/72 | Wt 232.0 lb

## 2020-07-07 DIAGNOSIS — Z23 Encounter for immunization: Secondary | ICD-10-CM | POA: Diagnosis not present

## 2020-07-07 DIAGNOSIS — O0990 Supervision of high risk pregnancy, unspecified, unspecified trimester: Secondary | ICD-10-CM

## 2020-07-07 DIAGNOSIS — O99891 Other specified diseases and conditions complicating pregnancy: Secondary | ICD-10-CM

## 2020-07-07 DIAGNOSIS — N939 Abnormal uterine and vaginal bleeding, unspecified: Secondary | ICD-10-CM

## 2020-07-07 DIAGNOSIS — O3440 Maternal care for other abnormalities of cervix, unspecified trimester: Secondary | ICD-10-CM

## 2020-07-07 DIAGNOSIS — O99210 Obesity complicating pregnancy, unspecified trimester: Secondary | ICD-10-CM

## 2020-07-07 DIAGNOSIS — Z2839 Other underimmunization status: Secondary | ICD-10-CM

## 2020-07-07 DIAGNOSIS — Z283 Underimmunization status: Secondary | ICD-10-CM

## 2020-07-07 NOTE — Progress Notes (Signed)
   PRENATAL VISIT NOTE  Subjective:  Stefanie Hale is a 39 y.o. [id number] at [redacted]w[redacted]d being seen today for ongoing prenatal care.  She is currently monitored for the following issues for this high-risk pregnancy and has SOMNOLENCE; EPISTAXIS, RECURRENT; Rash; Vaginal discharge; Anemia; Back pain; Irregular menses; Unspecified contraceptive management; Sinusitis; Medial tibial stress syndrome, right, initial encounter; Fibroids; Right knee injury, initial encounter; Supervision of high risk pregnancy, antepartum; AMA (advanced maternal age) multigravida 35+; Obesity affecting pregnancy, antepartum; History of surgery involving uterine cervix, antepartum; and Rubella non-immune status, antepartum on their problem list.  Patient reports bleeding and Spotting off and on.  Contractions: Not present. Vag. Bleeding: Scant.  Movement: Present. Denies leaking of fluid.   The following portions of the patient's history were reviewed and updated as appropriate: allergies, current medications, past family history, past medical history, past social history, past surgical history and problem list.   Objective:   Vitals:   07/07/20 1022  BP: 109/72  Weight: 232 lb (105.2 kg)    Fetal Status: Fetal Heart Rate (bpm): 145   Movement: Present     General:  Alert, oriented and cooperative. Patient is in no acute distress.  Skin: Skin is warm and dry. No rash noted.   Cardiovascular: Normal heart rate noted  Respiratory: Normal respiratory effort, no problems with respiration noted  Abdomen: Soft, gravid, appropriate for gestational age.  Pain/Pressure: Present     Pelvic: Cervical exam deferred        Extremities: Normal range of motion.  Edema: None  Mental Status: Normal mood and affect. Normal behavior. Normal judgment and thought content.   Assessment and Plan:  Pregnancy: G3P2002 at [redacted]w[redacted]d 1. Rubella non-immune status, antepartum     Vaccinate PP  2. Supervision of high risk pregnancy,  antepartum   3. Obesity affecting pregnancy, antepartum   4. History of surgery involving uterine cervix, antepartum    Has growth [place] scheduled     Intermittent pain in LLQ, ?fibroid degenerating  5. Vaginal bleeding    Declines reexamination.  Discussed friability seen on prior exam.  Discussed other sources.  Recent US was normal for placentation. They don't see evidence of accreta Precautions reviewed  Preterm labor symptoms and general obstetric precautions including but not limited to vaginal bleeding, contractions, leaking of fluid and fetal movement were reviewed in detail with the patient. Please refer to After Visit Summary for other counseling recommendations.   Return in about 2 weeks (around 07/21/2020) for [employer].  Future Appointments  Date Time Provider [place]  07/23/2020 10:30 AM Truett Mainland, DO [facility] None  08/10/2020 10:30 AM [facility] NURSE [facility]  08/10/2020 10:45 AM [facility] US4 [facility]    Hansel Feinstein, CNM

## 2020-07-07 NOTE — Patient Instructions (Signed)
Third Trimester of Pregnancy The third trimester is from week 28 through week 40 (months 7 through 9). The third trimester is a time when the unborn baby (fetus) is growing rapidly. At the end of the ninth month, the fetus is about 20 inches in length and weighs 6-10 pounds. Body changes during your third trimester Your body will continue to go through many changes during pregnancy. The changes vary from woman to woman. During the third trimester:  Your weight will continue to increase. You can expect to gain 25-35 pounds (11-16 kg) by the end of the pregnancy.  You may begin to get stretch marks on your hips, abdomen, and breasts.  You may urinate more often because the fetus is moving lower into your pelvis and pressing on your bladder.  You may develop or continue to have heartburn. This is caused by increased hormones that slow down muscles in the digestive tract.  You may develop or continue to have constipation because increased hormones slow digestion and cause the muscles that push waste through your intestines to relax.  You may develop hemorrhoids. These are swollen veins (varicose veins) in the rectum that can itch or be painful.  You may develop swollen, bulging veins (varicose veins) in your legs.  You may have increased body aches in the pelvis, back, or thighs. This is due to weight gain and increased hormones that are relaxing your joints.  You may have changes in your hair. These can include thickening of your hair, rapid growth, and changes in texture. Some women also have hair loss during or after pregnancy, or hair that feels dry or thin. Your hair will most likely return to normal after your baby is born.  Your breasts will continue to grow and they will continue to become tender. A yellow fluid (colostrum) may leak from your breasts. This is the first milk you are producing for your baby.  Your belly button may stick out.  You may notice more swelling in your hands,  face, or ankles.  You may have increased tingling or numbness in your hands, arms, and legs. The skin on your belly may also feel numb.  You may feel short of breath because of your expanding uterus.  You may have more problems sleeping. This can be caused by the size of your belly, increased need to urinate, and an increase in your body's metabolism.  You may notice the fetus "dropping," or moving lower in your abdomen (lightening).  You may have increased vaginal discharge.  You may notice your joints feel loose and you may have pain around your pelvic bone. What to expect at prenatal visits You will have prenatal exams every 2 weeks until week 36. Then you will have weekly prenatal exams. During a routine prenatal visit:  You will be weighed to make sure you and the baby are growing normally.  Your blood pressure will be taken.  Your abdomen will be measured to track your baby's growth.  The fetal heartbeat will be listened to.  Any test results from the previous visit will be discussed.  You may have a cervical check near your due date to see if your cervix has softened or thinned (effaced).  You will be tested for Group B streptococcus. This happens between 35 and 37 weeks. Your health care provider may ask you:  What your birth plan is.  How you are feeling.  If you are feeling the baby move.  If you have had any abnormal   symptoms, such as leaking fluid, bleeding, severe headaches, or abdominal cramping.  If you are using any tobacco products, including cigarettes, chewing tobacco, and electronic cigarettes.  If you have any questions. Other tests or screenings that may be performed during your third trimester include:  Blood tests that check for low iron levels (anemia).  Fetal testing to check the health, activity level, and growth of the fetus. Testing is done if you have certain medical conditions or if there are problems during the pregnancy.  Nonstress test  (NST). This test checks the health of your baby to make sure there are no signs of problems, such as the baby not getting enough oxygen. During this test, a belt is placed around your belly. The baby is made to move, and its heart rate is monitored during movement. What is false labor? False labor is a condition in which you feel small, irregular tightenings of the muscles in the womb (contractions) that usually go away with rest, changing position, or drinking water. These are called Braxton Hicks contractions. Contractions may last for hours, days, or even weeks before true labor sets in. If contractions come at regular intervals, become more frequent, increase in intensity, or become painful, you should see your health care provider. What are the signs of labor?  Abdominal cramps.  Regular contractions that start at 10 minutes apart and become stronger and more frequent with time.  Contractions that start on the top of the uterus and spread down to the lower abdomen and back.  Increased pelvic pressure and dull back pain.  A watery or bloody mucus discharge that comes from the vagina.  Leaking of amniotic fluid. This is also known as your "water breaking." It could be a slow trickle or a gush. Let your health care provider know if it has a color or strange odor. If you have any of these signs, call your health care provider right away, even if it is before your due date. Follow these instructions at home: Medicines  Follow your health care provider's instructions regarding medicine use. Specific medicines may be either safe or unsafe to take during pregnancy.  Take a prenatal vitamin that contains at least 600 micrograms (mcg) of folic acid.  If you develop constipation, try taking a stool softener if your health care provider approves. Eating and drinking   Eat a balanced diet that includes fresh fruits and vegetables, whole grains, good sources of protein such as meat, eggs, or tofu,  and low-fat dairy. Your health care provider will help you determine the amount of weight gain that is right for you.  Avoid raw meat and uncooked cheese. These carry germs that can cause birth defects in the baby.  If you have low calcium intake from food, talk to your health care provider about whether you should take a daily calcium supplement.  Eat four or five small meals rather than three large meals a day.  Limit foods that are high in fat and processed sugars, such as fried and sweet foods.  To prevent constipation: ? Drink enough fluid to keep your urine clear or pale yellow. ? Eat foods that are high in fiber, such as fresh fruits and vegetables, whole grains, and beans. Activity  Exercise only as directed by your health care provider. Most women can continue their usual exercise routine during pregnancy. Try to exercise for 30 minutes at least 5 days a week. Stop exercising if you experience uterine contractions.  Avoid heavy lifting.  Do   not exercise in extreme heat or humidity, or at high altitudes.  Wear low-heel, comfortable shoes.  Practice good posture.  You may continue to have sex unless your health care provider tells you otherwise. Relieving pain and discomfort  Take frequent breaks and rest with your legs elevated if you have leg cramps or low back pain.  Take warm sitz baths to soothe any pain or discomfort caused by hemorrhoids. Use hemorrhoid cream if your health care provider approves.  Wear a good support bra to prevent discomfort from breast tenderness.  If you develop varicose veins: ? Wear support pantyhose or compression stockings as told by your healthcare provider. ? Elevate your feet for 15 minutes, 3-4 times a day. Prenatal care  Write down your questions. Take them to your prenatal visits.  Keep all your prenatal visits as told by your health care provider. This is important. Safety  Wear your seat belt at all times when driving.  Make  a list of emergency phone numbers, including numbers for family, friends, the hospital, and police and fire departments. General instructions  Avoid cat litter boxes and soil used by cats. These carry germs that can cause birth defects in the baby. If you have a cat, ask someone to clean the litter box for you.  Do not travel far distances unless it is absolutely necessary and only with the approval of your health care provider.  Do not use hot tubs, steam rooms, or saunas.  Do not drink alcohol.  Do not use any products that contain nicotine or tobacco, such as cigarettes and e-cigarettes. If you need help quitting, ask your health care provider.  Do not use any medicinal herbs or unprescribed drugs. These chemicals affect the formation and growth of the baby.  Do not douche or use tampons or scented sanitary pads.  Do not cross your legs for long periods of time.  To prepare for the arrival of your baby: ? Take prenatal classes to understand, practice, and ask questions about labor and delivery. ? Make a trial run to the hospital. ? Visit the hospital and tour the maternity area. ? Arrange for maternity or paternity leave through employers. ? Arrange for family and friends to take care of pets while you are in the hospital. ? Purchase a rear-facing car seat and make sure you know how to install it in your car. ? Pack your hospital bag. ? Prepare the baby's nursery. Make sure to remove all pillows and stuffed animals from the baby's crib to prevent suffocation.  Visit your dentist if you have not gone during your pregnancy. Use a soft toothbrush to brush your teeth and be gentle when you floss. Contact a health care provider if:  You are unsure if you are in labor or if your water has broken.  You become dizzy.  You have mild pelvic cramps, pelvic pressure, or nagging pain in your abdominal area.  You have lower back pain.  You have persistent nausea, vomiting, or  diarrhea.  You have an unusual or bad smelling vaginal discharge.  You have pain when you urinate. Get help right away if:  Your water breaks before 37 weeks.  You have regular contractions less than 5 minutes apart before 37 weeks.  You have a fever.  You are leaking fluid from your vagina.  You have spotting or bleeding from your vagina.  You have severe abdominal pain or cramping.  You have rapid weight loss or weight gain.  You have   shortness of breath with chest pain.  You notice sudden or extreme swelling of your face, hands, ankles, feet, or legs.  Your baby makes fewer than 10 movements in 2 hours.  You have severe headaches that do not go away when you take medicine.  You have vision changes. Summary  The third trimester is from week 28 through week 40, months 7 through 9. The third trimester is a time when the unborn baby (fetus) is growing rapidly.  During the third trimester, your discomfort may increase as you and your baby continue to gain weight. You may have abdominal, leg, and back pain, sleeping problems, and an increased need to urinate.  During the third trimester your breasts will keep growing and they will continue to become tender. A yellow fluid (colostrum) may leak from your breasts. This is the first milk you are producing for your baby.  False labor is a condition in which you feel small, irregular tightenings of the muscles in the womb (contractions) that eventually go away. These are called Braxton Hicks contractions. Contractions may last for hours, days, or even weeks before true labor sets in.  Signs of labor can include: abdominal cramps; regular contractions that start at 10 minutes apart and become stronger and more frequent with time; watery or bloody mucus discharge that comes from the vagina; increased pelvic pressure and dull back pain; and leaking of amniotic fluid. This information is not intended to replace advice given to you by your  health care provider. Make sure you discuss any questions you have with your health care provider. Document Revised: 12/20/2018 Document Reviewed: 10/04/2016 Elsevier Patient Education  2020 Elsevier Inc.  

## 2020-07-07 NOTE — Progress Notes (Signed)
Patient still having ongoing occasional spotting. [name] RN

## 2020-07-21 ENCOUNTER — Encounter: Payer: BC Managed Care – PPO | Admitting: Advanced Practice Midwife

## 2020-07-23 ENCOUNTER — Other Ambulatory Visit: Payer: Self-pay

## 2020-07-23 ENCOUNTER — Ambulatory Visit (INDEPENDENT_AMBULATORY_CARE_PROVIDER_SITE_OTHER): Payer: BC Managed Care – PPO | Admitting: Family Medicine

## 2020-07-23 VITALS — BP 112/74 | HR 90 | Wt 243.0 lb

## 2020-07-23 DIAGNOSIS — Z283 Underimmunization status: Secondary | ICD-10-CM

## 2020-07-23 DIAGNOSIS — O3440 Maternal care for other abnormalities of cervix, unspecified trimester: Secondary | ICD-10-CM

## 2020-07-23 DIAGNOSIS — O99891 Other specified diseases and conditions complicating pregnancy: Secondary | ICD-10-CM

## 2020-07-23 DIAGNOSIS — O09899 Supervision of other high risk pregnancies, unspecified trimester: Secondary | ICD-10-CM

## 2020-07-23 DIAGNOSIS — O0990 Supervision of high risk pregnancy, unspecified, unspecified trimester: Secondary | ICD-10-CM

## 2020-07-23 DIAGNOSIS — O99210 Obesity complicating pregnancy, unspecified trimester: Secondary | ICD-10-CM

## 2020-07-23 DIAGNOSIS — O09523 Supervision of elderly multigravida, third trimester: Secondary | ICD-10-CM

## 2020-07-23 DIAGNOSIS — Z3A34 34 weeks gestation of pregnancy: Secondary | ICD-10-CM

## 2020-07-23 NOTE — Progress Notes (Signed)
r 

## 2020-07-23 NOTE — Progress Notes (Signed)
   PRENATAL VISIT NOTE  Subjective:  Stefanie Hale is a 39 y.o. [id number] at 34w1dbeing seen today for ongoing prenatal care.  She is currently monitored for the following issues for this high-risk pregnancy and has SOMNOLENCE; EPISTAXIS, RECURRENT; Rash; Vaginal discharge; Anemia; Back pain; Irregular menses; Unspecified contraceptive management; Sinusitis; Medial tibial stress syndrome, right, initial encounter; Fibroids; Right knee injury, initial encounter; Supervision of high risk pregnancy, antepartum; AMA (advanced maternal age) multigravida 35+; Obesity affecting pregnancy, antepartum; History of surgery involving uterine cervix, antepartum; and Rubella non-immune status, antepartum on their problem list.  Patient reports no complaints.  Contractions: Irritability. Vag. Bleeding: Scant.  Movement: Present. Denies leaking of fluid.   The following portions of the patient's history were reviewed and updated as appropriate: allergies, current medications, past family history, past medical history, past social history, past surgical history and problem list.   Objective:   Vitals:   07/23/20 1041  BP: 112/74  Pulse: 90  Weight: 243 lb (110.2 kg)    Fetal Status: Fetal Heart Rate (bpm): 138   Movement: Present     General:  Alert, oriented and cooperative. Patient is in no acute distress.  Skin: Skin is warm and dry. No rash noted.   Cardiovascular: Normal heart rate noted  Respiratory: Normal respiratory effort, no problems with respiration noted  Abdomen: Soft, gravid, appropriate for gestational age.  Pain/Pressure: Present     Pelvic: Cervical exam deferred        Extremities: Normal range of motion.  Edema: None  Mental Status: Normal mood and affect. Normal behavior. Normal judgment and thought content.   Assessment and Plan:  Pregnancy: G3P2002 at 360w1d. [redacted] weeks gestation of pregnancy  2. Supervision of high risk pregnancy, antepartum FHT and FH normal  3.  Multigravida of advanced maternal age in third trimester On ASA [id number]. History of surgery involving uterine cervix, antepartum Had endometrial ablation. Growth US [place] No evidence of acreta - continues to be low risk.   5. Obesity affecting pregnancy, antepartum  6. Rubella non-immune status, antepartum PP MMR  Preterm labor symptoms and general obstetric precautions including but not limited to vaginal bleeding, contractions, leaking of fluid and fetal movement were reviewed in detail with the patient. Please refer to After Visit Summary for other counseling recommendations.   Return in about 2 weeks (around 08/06/2020) for OB f/u, GBS.  Future Appointments  Date Time Provider [place]1/29/2021 10:30 AM [facility]1/29/2021 10:45 AM [facility] US4 [facility]US WMCC[facility]O

## 2020-08-05 ENCOUNTER — Ambulatory Visit (INDEPENDENT_AMBULATORY_CARE_PROVIDER_SITE_OTHER): Payer: BC Managed Care – PPO | Admitting: Family Medicine

## 2020-08-05 ENCOUNTER — Other Ambulatory Visit (HOSPITAL_COMMUNITY)
Admission: RE | Admit: 2020-08-05 | Discharge: 2020-08-05 | Disposition: A | Discharge status: Home / Self Care | Payer: BC Managed Care – PPO | Source: Ambulatory Visit | Attending: Family Medicine | Admitting: Family Medicine

## 2020-08-05 ENCOUNTER — Other Ambulatory Visit: Payer: Self-pay

## 2020-08-05 DIAGNOSIS — Z2839 Other underimmunization status: Secondary | ICD-10-CM

## 2020-08-05 DIAGNOSIS — O99210 Obesity complicating pregnancy, unspecified trimester: Secondary | ICD-10-CM

## 2020-08-05 DIAGNOSIS — O0990 Supervision of high risk pregnancy, unspecified, unspecified trimester: Secondary | ICD-10-CM

## 2020-08-05 DIAGNOSIS — O99891 Other specified diseases and conditions complicating pregnancy: Secondary | ICD-10-CM

## 2020-08-05 DIAGNOSIS — Z283 Underimmunization status: Secondary | ICD-10-CM

## 2020-08-05 DIAGNOSIS — O3440 Maternal care for other abnormalities of cervix, unspecified trimester: Secondary | ICD-10-CM

## 2020-08-05 NOTE — Patient Instructions (Signed)

## 2020-08-05 NOTE — Progress Notes (Signed)
Patient confirmed vertex with bedside ultrasound. [name] RN

## 2020-08-05 NOTE — Progress Notes (Signed)
PRENATAL VISIT NOTE  Subjective:  Stefanie Hale is a 39 y.o. [id number] at 56w0dbeing seen today for ongoing prenatal care.  She is currently monitored for the following issues for this low-risk pregnancy and has SOMNOLENCE; EPISTAXIS, RECURRENT; Rash; Vaginal discharge; Anemia; Back pain; Irregular menses; Unspecified contraceptive management; Sinusitis; Medial tibial stress syndrome, right, initial encounter; Fibroids; Right knee injury, initial encounter; Supervision of high risk pregnancy, antepartum; AMA (advanced maternal age) multigravida 35+; Obesity affecting pregnancy, antepartum; History of surgery involving uterine cervix, antepartum; and Rubella non-immune status, antepartum on their problem list.  Patient reports no complaints.  Contractions: Irritability. Vag. Bleeding: Scant.  Movement: Present. Denies leaking of fluid.   The following portions of the patient's history were reviewed and updated as appropriate: allergies, current medications, past family history, past medical history, past social history, past surgical history and problem list.   Objective:   Vitals:   08/05/20 1338  BP: 117/73  Pulse: 91  Weight: 242 lb (109.8 kg)    Fetal Status: Fetal Heart Rate (bpm): 145 Fundal Height: 37 cm Movement: Present     General:  Alert, oriented and cooperative. Patient is in no acute distress.  Skin: Skin is warm and dry. No rash noted.   Cardiovascular: Normal heart rate noted  Respiratory: Normal respiratory effort, no problems with respiration noted  Abdomen: Soft, gravid, appropriate for gestational age.  Pain/Pressure: Present     Pelvic: Cervical exam deferred Dilation: Fingertip Effacement (%): 20 Station: -2  Extremities: Normal range of motion.  Edema: None  Mental Status: Normal mood and affect. Normal behavior. Normal judgment and thought content.   Assessment and Plan:  Pregnancy: G3P2002 at 332w0d. Rubella non-immune status, antepartum MMR  pp  2. Supervision of high risk pregnancy, antepartum Cultures today - GC/Chlamydia probe amp [facility] B Culture+Rflx  3. Obesity affecting pregnancy, antepartum   4. History of surgery involving uterine cervix, antepartum No evidence of accreta Normal growth, f/u scheduled  Preterm labor symptoms and general obstetric precautions including but not limited to vaginal bleeding, contractions, leaking of fluid and fetal movement were reviewed in detail with the patient. Please refer to After Visit Summary for other counseling recommendations.   Return in 1 week (on 08/12/2020).  Future Appointments  Date Time Provider [place]11/29/2021 10:30 AM [facility]11/29/2021 10:45 AM [facility] US4 [facility]12/09/2019  9:45 AM Stinson, JaTanna SavoyDO [facility] None    TaDonnamae JudeMD

## 2020-08-09 LAB — STREP GP B CULTURE+RFLX: Strep Gp B Culture+Rflx: NEGATIVE

## 2020-08-10 ENCOUNTER — Encounter: Payer: Self-pay | Admitting: *Deleted

## 2020-08-10 ENCOUNTER — Ambulatory Visit: Payer: BC Managed Care – PPO | Admitting: *Deleted

## 2020-08-10 ENCOUNTER — Other Ambulatory Visit: Payer: Self-pay

## 2020-08-10 ENCOUNTER — Ambulatory Visit: Payer: BC Managed Care – PPO | Attending: Obstetrics and Gynecology

## 2020-08-10 DIAGNOSIS — O3429 Maternal care due to uterine scar from other previous surgery: Secondary | ICD-10-CM

## 2020-08-10 DIAGNOSIS — O09523 Supervision of elderly multigravida, third trimester: Secondary | ICD-10-CM | POA: Diagnosis not present

## 2020-08-10 DIAGNOSIS — Z283 Underimmunization status: Secondary | ICD-10-CM | POA: Insufficient documentation

## 2020-08-10 DIAGNOSIS — O0990 Supervision of high risk pregnancy, unspecified, unspecified trimester: Secondary | ICD-10-CM | POA: Diagnosis present

## 2020-08-10 DIAGNOSIS — O3413 Maternal care for benign tumor of corpus uteri, third trimester: Secondary | ICD-10-CM

## 2020-08-10 DIAGNOSIS — O99210 Obesity complicating pregnancy, unspecified trimester: Secondary | ICD-10-CM | POA: Diagnosis present

## 2020-08-10 DIAGNOSIS — Z3A36 36 weeks gestation of pregnancy: Secondary | ICD-10-CM

## 2020-08-10 DIAGNOSIS — O99891 Other specified diseases and conditions complicating pregnancy: Secondary | ICD-10-CM | POA: Insufficient documentation

## 2020-08-10 DIAGNOSIS — E669 Obesity, unspecified: Secondary | ICD-10-CM

## 2020-08-10 DIAGNOSIS — O3440 Maternal care for other abnormalities of cervix, unspecified trimester: Secondary | ICD-10-CM | POA: Diagnosis present

## 2020-08-10 DIAGNOSIS — O99213 Obesity complicating pregnancy, third trimester: Secondary | ICD-10-CM

## 2020-08-10 DIAGNOSIS — D259 Leiomyoma of uterus, unspecified: Secondary | ICD-10-CM | POA: Insufficient documentation

## 2020-08-10 DIAGNOSIS — Z2839 Other underimmunization status: Secondary | ICD-10-CM

## 2020-08-11 LAB — GC/CHLAMYDIA PROBE AMP (~~LOC~~) NOT AT ARMC
Chlamydia: NEGATIVE
Comment: NEGATIVE
Comment: NORMAL
Neisseria Gonorrhea: NEGATIVE

## 2020-08-12 ENCOUNTER — Other Ambulatory Visit: Payer: Self-pay

## 2020-08-12 ENCOUNTER — Other Ambulatory Visit: Payer: Self-pay | Admitting: Family Medicine

## 2020-08-12 ENCOUNTER — Ambulatory Visit (INDEPENDENT_AMBULATORY_CARE_PROVIDER_SITE_OTHER): Payer: BC Managed Care – PPO | Admitting: Family Medicine

## 2020-08-12 DIAGNOSIS — O99210 Obesity complicating pregnancy, unspecified trimester: Secondary | ICD-10-CM

## 2020-08-12 DIAGNOSIS — Z283 Underimmunization status: Secondary | ICD-10-CM

## 2020-08-12 DIAGNOSIS — O09899 Supervision of other high risk pregnancies, unspecified trimester: Secondary | ICD-10-CM

## 2020-08-12 DIAGNOSIS — O3440 Maternal care for other abnormalities of cervix, unspecified trimester: Secondary | ICD-10-CM

## 2020-08-12 DIAGNOSIS — O0990 Supervision of high risk pregnancy, unspecified, unspecified trimester: Secondary | ICD-10-CM

## 2020-08-12 DIAGNOSIS — O99891 Other specified diseases and conditions complicating pregnancy: Secondary | ICD-10-CM

## 2020-08-12 DIAGNOSIS — Z2839 Other underimmunization status: Secondary | ICD-10-CM

## 2020-08-12 NOTE — Progress Notes (Signed)
   PRENATAL VISIT NOTE  Subjective:  Stefanie Hale is a 39 y.o. S9Q3300 at [redacted]w[redacted]d being seen today for ongoing prenatal care.  She is currently monitored for the following issues for this high-risk pregnancy and has SOMNOLENCE; EPISTAXIS, RECURRENT; Rash; Vaginal discharge; Anemia; Back pain; Irregular menses; Unspecified contraceptive management; Sinusitis; Medial tibial stress syndrome, right, initial encounter; Fibroids; Right knee injury, initial encounter; Supervision of high risk pregnancy, antepartum; AMA (advanced maternal age) multigravida 35+; Obesity affecting pregnancy, antepartum; History of surgery involving uterine cervix, antepartum; and Rubella non-immune status, antepartum on their problem list.  Patient reports occasional contractions.  Contractions: Irritability. Vag. Bleeding: None.  Movement: Present. Denies leaking of fluid.   The following portions of the patient's history were reviewed and updated as appropriate: allergies, current medications, past family history, past medical history, past social history, past surgical history and problem list.   Objective:   Vitals:   08/12/20 0956  BP: 115/74  Pulse: 85  Weight: 247 lb (112 kg)    Fetal Status: Fetal Heart Rate (bpm): 145 Fundal Height: 38 cm Movement: Present  Presentation: Vertex  General:  Alert, oriented and cooperative. Patient is in no acute distress.  Skin: Skin is warm and dry. No rash noted.   Cardiovascular: Normal heart rate noted  Respiratory: Normal respiratory effort, no problems with respiration noted  Abdomen: Soft, gravid, appropriate for gestational age.  Pain/Pressure: Present     Pelvic: Cervical exam deferred        Extremities: Normal range of motion.  Edema: None  Mental Status: Normal mood and affect. Normal behavior. Normal judgment and thought content.   Assessment and Plan:  Pregnancy: G3P2002 at [redacted]w[redacted]d 1. Rubella non-immune status, antepartum MMR post partum  2.  Supervision of high risk pregnancy, antepartum FHT and FH normal  3. Obesity affecting pregnancy, antepartum  4. History of surgery involving uterine cervix, antepartum Had ablation No evidence of FGR (fetal growth is 75% on [place] 11/29) No evidence of acreta on [place]  Term labor symptoms and general obstetric precautions including but not limited to vaginal bleeding, contractions, leaking of fluid and fetal movement were reviewed in detail with the patient. Please refer to After Visit Summary for other counseling recommendations.   No follow-ups on file.  Future Appointments  Date Time Provider [place]  08/19/2020  3:00 PM Truett Mainland, DO [facility] None    Truett Mainland, DO

## 2020-08-13 ENCOUNTER — Telehealth (HOSPITAL_COMMUNITY): Payer: Self-pay | Admitting: *Deleted

## 2020-08-13 NOTE — Telephone Encounter (Signed)
Preadmission screen  

## 2020-08-14 ENCOUNTER — Encounter (HOSPITAL_COMMUNITY): Payer: Self-pay | Admitting: *Deleted

## 2020-08-14 ENCOUNTER — Telehealth (HOSPITAL_COMMUNITY): Payer: Self-pay | Admitting: *Deleted

## 2020-08-14 NOTE — Telephone Encounter (Signed)
Preadmission screen  

## 2020-08-16 ENCOUNTER — Other Ambulatory Visit: Payer: Self-pay | Admitting: Advanced Practice Midwife

## 2020-08-19 ENCOUNTER — Ambulatory Visit (INDEPENDENT_AMBULATORY_CARE_PROVIDER_SITE_OTHER): Payer: BC Managed Care – PPO | Admitting: Family Medicine

## 2020-08-19 ENCOUNTER — Other Ambulatory Visit: Payer: Self-pay

## 2020-08-19 VITALS — BP 122/70 | HR 81 | Wt 247.0 lb

## 2020-08-19 DIAGNOSIS — O99891 Other specified diseases and conditions complicating pregnancy: Secondary | ICD-10-CM

## 2020-08-19 DIAGNOSIS — Z283 Underimmunization status: Secondary | ICD-10-CM

## 2020-08-19 DIAGNOSIS — O99210 Obesity complicating pregnancy, unspecified trimester: Secondary | ICD-10-CM

## 2020-08-19 DIAGNOSIS — Z2839 Other underimmunization status: Secondary | ICD-10-CM

## 2020-08-19 DIAGNOSIS — O09523 Supervision of elderly multigravida, third trimester: Secondary | ICD-10-CM

## 2020-08-19 DIAGNOSIS — O3440 Maternal care for other abnormalities of cervix, unspecified trimester: Secondary | ICD-10-CM

## 2020-08-19 DIAGNOSIS — O0990 Supervision of high risk pregnancy, unspecified, unspecified trimester: Secondary | ICD-10-CM

## 2020-08-19 DIAGNOSIS — Z3A38 38 weeks gestation of pregnancy: Secondary | ICD-10-CM

## 2020-08-19 NOTE — Progress Notes (Addendum)
   PRENATAL VISIT NOTE  Subjective:  Stefanie Hale is a 39 y.o. [id number] at [redacted]w[redacted]d being seen today for ongoing prenatal care.  She is currently monitored for the following issues for this high-risk pregnancy and has SOMNOLENCE; EPISTAXIS, RECURRENT; Rash; Vaginal discharge; Anemia; Back pain; Irregular menses; Unspecified contraceptive management; Sinusitis; Medial tibial stress syndrome, right, initial encounter; Fibroids; Right knee injury, initial encounter; Supervision of high risk pregnancy, antepartum; AMA (advanced maternal age) multigravida 35+; Obesity affecting pregnancy, antepartum; History of surgery involving uterine cervix, antepartum; and Rubella non-immune status, antepartum on their problem list.  Patient reports no complaints.  Contractions: Irregular. Vag. Bleeding: None.  Movement: Present. Denies leaking of fluid.   The following portions of the patient's history were reviewed and updated as appropriate: allergies, current medications, past family history, past medical history, past social history, past surgical history and problem list.   Objective:   Vitals:   08/19/20 1510  BP: 122/70  Pulse: 81  Weight: 247 lb 0.6 oz (112.1 kg)    Fetal Status:     Movement: Present     General:  Alert, oriented and cooperative. Patient is in no acute distress.  Skin: Skin is warm and dry. No rash noted.   Cardiovascular: Normal heart rate noted  Respiratory: Normal respiratory effort, no problems with respiration noted  Abdomen: Soft, gravid, appropriate for gestational age.  Pain/Pressure: Present     Pelvic: Cervical exam deferred        Extremities: Normal range of motion.  Edema: None  Mental Status: Normal mood and affect. Normal behavior. Normal judgment and thought content.   Assessment and Plan:  Pregnancy: G3P2002 at [redacted]w[redacted]d 1. [redacted] weeks gestation of pregnancy  2. Supervision of high risk pregnancy, antepartum FHT and FH normal  3. Rubella non-immune status,  antepartum MMR post delivery  4. History of surgery involving uterine cervix, antepartum Had ablation. Low suspicion for abnormal placentation. Induce at 39 weeks Growth normal (EFW 75%)  5. Multigravida of advanced maternal age in third trimester On ASA $Remo'81mg'EYGlH$   6. Obesity affecting pregnancy, antepartum   Term labor symptoms and general obstetric precautions including but not limited to vaginal bleeding, contractions, leaking of fluid and fetal movement were reviewed in detail with the patient. Please refer to After Visit Summary for other counseling recommendations.   No follow-ups on file.  Future Appointments  Date Time Provider [place]  08/24/2020  9:30 AM MC-SCREENING [facility]-SDSC None  08/26/2020  8:20 AM MC-LD SCHED ROOM [facility]-INDC None  09/24/2020  1:00 PM Truett Mainland, DO [facility] None    Truett Mainland, DO

## 2020-08-24 ENCOUNTER — Other Ambulatory Visit (HOSPITAL_COMMUNITY)
Admission: RE | Admit: 2020-08-24 | Discharge: 2020-08-24 | Disposition: A | Discharge status: Home / Self Care | Payer: BC Managed Care – PPO | Source: Ambulatory Visit | Attending: Family Medicine | Admitting: Family Medicine

## 2020-08-24 DIAGNOSIS — Z20822 Contact with and (suspected) exposure to covid-19: Secondary | ICD-10-CM | POA: Insufficient documentation

## 2020-08-24 DIAGNOSIS — Z01812 Encounter for preprocedural laboratory examination: Secondary | ICD-10-CM | POA: Insufficient documentation

## 2020-08-24 LAB — SARS CORONAVIRUS 2 (TAT 6-24 HRS): SARS Coronavirus 2: NEGATIVE

## 2020-08-26 ENCOUNTER — Inpatient Hospital Stay (HOSPITAL_COMMUNITY): Payer: BC Managed Care – PPO

## 2020-08-26 ENCOUNTER — Inpatient Hospital Stay (HOSPITAL_COMMUNITY): Payer: BC Managed Care – PPO | Admitting: Student

## 2020-08-26 ENCOUNTER — Inpatient Hospital Stay (HOSPITAL_COMMUNITY)
Admission: AD | Admit: 2020-08-26 | Discharge: 2020-08-29 | DRG: 784 | Disposition: A | Discharge status: Home / Self Care | Payer: BC Managed Care – PPO | Attending: Family Medicine | Admitting: Family Medicine

## 2020-08-26 ENCOUNTER — Encounter (HOSPITAL_COMMUNITY): Payer: Self-pay | Admitting: Family Medicine

## 2020-08-26 ENCOUNTER — Other Ambulatory Visit: Payer: Self-pay

## 2020-08-26 ENCOUNTER — Encounter (HOSPITAL_COMMUNITY)
Admission: AD | Disposition: A | Discharge status: Home / Self Care | Payer: Self-pay | Source: Home / Self Care | Attending: Family Medicine

## 2020-08-26 DIAGNOSIS — D259 Leiomyoma of uterus, unspecified: Secondary | ICD-10-CM | POA: Diagnosis present

## 2020-08-26 DIAGNOSIS — O99214 Obesity complicating childbirth: Secondary | ICD-10-CM | POA: Diagnosis present

## 2020-08-26 DIAGNOSIS — D62 Acute posthemorrhagic anemia: Secondary | ICD-10-CM | POA: Diagnosis not present

## 2020-08-26 DIAGNOSIS — Z302 Encounter for sterilization: Secondary | ICD-10-CM | POA: Diagnosis not present

## 2020-08-26 DIAGNOSIS — Z98891 History of uterine scar from previous surgery: Secondary | ICD-10-CM

## 2020-08-26 DIAGNOSIS — O09529 Supervision of elderly multigravida, unspecified trimester: Secondary | ICD-10-CM

## 2020-08-26 DIAGNOSIS — Z9851 Tubal ligation status: Secondary | ICD-10-CM

## 2020-08-26 DIAGNOSIS — Z2839 Other underimmunization status: Secondary | ICD-10-CM

## 2020-08-26 DIAGNOSIS — Z23 Encounter for immunization: Secondary | ICD-10-CM

## 2020-08-26 DIAGNOSIS — O322XX0 Maternal care for transverse and oblique lie, not applicable or unspecified: Secondary | ICD-10-CM | POA: Diagnosis present

## 2020-08-26 DIAGNOSIS — O3413 Maternal care for benign tumor of corpus uteri, third trimester: Secondary | ICD-10-CM | POA: Diagnosis present

## 2020-08-26 DIAGNOSIS — O9081 Anemia of the puerperium: Secondary | ICD-10-CM | POA: Diagnosis not present

## 2020-08-26 DIAGNOSIS — Z20822 Contact with and (suspected) exposure to covid-19: Secondary | ICD-10-CM | POA: Diagnosis present

## 2020-08-26 DIAGNOSIS — D219 Benign neoplasm of connective and other soft tissue, unspecified: Secondary | ICD-10-CM | POA: Diagnosis present

## 2020-08-26 DIAGNOSIS — O99210 Obesity complicating pregnancy, unspecified trimester: Secondary | ICD-10-CM

## 2020-08-26 DIAGNOSIS — O3440 Maternal care for other abnormalities of cervix, unspecified trimester: Secondary | ICD-10-CM

## 2020-08-26 DIAGNOSIS — Z3A39 39 weeks gestation of pregnancy: Secondary | ICD-10-CM

## 2020-08-26 DIAGNOSIS — O3429 Maternal care due to uterine scar from other previous surgery: Principal | ICD-10-CM | POA: Diagnosis present

## 2020-08-26 DIAGNOSIS — O0990 Supervision of high risk pregnancy, unspecified, unspecified trimester: Secondary | ICD-10-CM

## 2020-08-26 LAB — DIC (DISSEMINATED INTRAVASCULAR COAGULATION)PANEL
D-Dimer, Quant: >20 ug/mL-FEU — ABNORMAL HIGH (ref 0.00–0.50)
Fibrinogen: 320 mg/dL (ref 210–475)
INR: 1.2 (ref 0.8–1.2)
Platelets: 170 10*3/uL (ref 150–400)
Prothrombin Time: 14.5 seconds (ref 11.4–15.2)
Smear Review: NONE SEEN
aPTT: 30 seconds (ref 24–36)

## 2020-08-26 LAB — CBC
HCT: 34.3 % — ABNORMAL LOW (ref 36.0–46.0)
HCT: 31 % — ABNORMAL LOW (ref 36.0–46.0)
HCT: 29.7 % — ABNORMAL LOW (ref 36.0–46.0)
Hemoglobin: 10.9 g/dL — ABNORMAL LOW (ref 12.0–15.0)
Hemoglobin: 10.1 g/dL — ABNORMAL LOW (ref 12.0–15.0)
Hemoglobin: 9.8 g/dL — ABNORMAL LOW (ref 12.0–15.0)
MCH: 31.2 pg (ref 26.0–34.0)
MCH: 31.1 pg (ref 26.0–34.0)
MCH: 30.7 pg (ref 26.0–34.0)
MCHC: 31.8 g/dL (ref 30.0–36.0)
MCHC: 32.6 g/dL (ref 30.0–36.0)
MCHC: 33 g/dL (ref 30.0–36.0)
MCV: 98.3 fL (ref 80.0–100.0)
MCV: 95.4 fL (ref 80.0–100.0)
MCV: 93.1 fL (ref 80.0–100.0)
Platelets: 212 10*3/uL (ref 150–400)
Platelets: 155 10*3/uL (ref 150–400)
Platelets: 167 10*3/uL (ref 150–400)
RBC: 3.49 MIL/uL — ABNORMAL LOW (ref 3.87–5.11)
RBC: 3.25 MIL/uL — ABNORMAL LOW (ref 3.87–5.11)
RBC: 3.19 MIL/uL — ABNORMAL LOW (ref 3.87–5.11)
RDW: 12.9 % (ref 11.5–15.5)
RDW: 14.6 % (ref 11.5–15.5)
RDW: 14.6 % (ref 11.5–15.5)
WBC: 6 10*3/uL (ref 4.0–10.5)
WBC: 14.2 10*3/uL — ABNORMAL HIGH (ref 4.0–10.5)
WBC: 15.4 10*3/uL — ABNORMAL HIGH (ref 4.0–10.5)
nRBC: 0 % (ref 0.0–0.2)
nRBC: 0 % (ref 0.0–0.2)
nRBC: 0 % (ref 0.0–0.2)

## 2020-08-26 LAB — POSTPARTUM HEMORRHAGE PROTOCOL (BB NOTIFICATION)

## 2020-08-26 LAB — CREATININE, SERUM
Creatinine, Ser: 0.69 mg/dL (ref 0.44–1.00)
GFR, Estimated: >60 mL/min (ref >60)

## 2020-08-26 LAB — PREPARE RBC (CROSSMATCH)

## 2020-08-26 SURGERY — Surgical Case
Anesthesia: Spinal

## 2020-08-26 MED ORDER — OXYCODONE HCL 5 MG PO TABS
5.0000 mg | ORAL_TABLET | ORAL | Status: DC | PRN
  Administered 2020-08-28: 5 mg via ORAL
  Administered 2020-08-29: 10 mg via ORAL
  Filled 2020-08-26: qty 1
  Filled 2020-08-26: qty 2

## 2020-08-26 MED ORDER — SODIUM CHLORIDE 0.9 % IV SOLN: INTRAVENOUS | Status: DC | PRN

## 2020-08-26 MED ORDER — KETOROLAC TROMETHAMINE 30 MG/ML IJ SOLN: 30.0000 mg | Freq: Four times a day (QID) | INTRAMUSCULAR | Status: DC

## 2020-08-26 MED ORDER — DIPHENHYDRAMINE HCL 25 MG PO CAPS
25.0000 mg | ORAL_CAPSULE | Freq: Four times a day (QID) | ORAL | Status: DC | PRN
  Administered 2020-08-26: 20:00:00 25 mg via ORAL

## 2020-08-26 MED ORDER — LACTATED RINGERS IV SOLN: INTRAVENOUS | Status: DC

## 2020-08-26 MED ORDER — SOD CITRATE-CITRIC ACID 500-334 MG/5ML PO SOLN: 30.0000 mL | ORAL | Status: DC

## 2020-08-26 MED ORDER — NALBUPHINE HCL 10 MG/ML IJ SOLN: 5.0000 mg | INTRAMUSCULAR | Status: DC | PRN

## 2020-08-26 MED ORDER — FENTANYL CITRATE (PF) 100 MCG/2ML IJ SOLN
INTRAMUSCULAR | Status: AC
  Filled 2020-08-26: qty 2

## 2020-08-26 MED ORDER — SIMETHICONE 80 MG PO CHEW
80.0000 mg | CHEWABLE_TABLET | Freq: Three times a day (TID) | ORAL | Status: DC
  Administered 2020-08-26 – 2020-08-29 (×6): 80 mg via ORAL
  Filled 2020-08-26 (×6): qty 1

## 2020-08-26 MED ORDER — TERBUTALINE SULFATE 1 MG/ML IJ SOLN: 0.2500 mg | Freq: Once | INTRAMUSCULAR | Status: DC | PRN

## 2020-08-26 MED ORDER — NALOXONE HCL 0.4 MG/ML IJ SOLN: 0.4000 mg | INTRAMUSCULAR | Status: DC | PRN

## 2020-08-26 MED ORDER — KETOROLAC TROMETHAMINE 30 MG/ML IJ SOLN
INTRAMUSCULAR | Status: AC
  Filled 2020-08-26: qty 1

## 2020-08-26 MED ORDER — TRANEXAMIC ACID-NACL 1000-0.7 MG/100ML-% IV SOLN
INTRAVENOUS | Status: AC
  Filled 2020-08-26: qty 100

## 2020-08-26 MED ORDER — OXYTOCIN-SODIUM CHLORIDE 30-0.9 UT/500ML-% IV SOLN
INTRAVENOUS | Status: AC
  Filled 2020-08-26: qty 1000

## 2020-08-26 MED ORDER — CLINDAMYCIN PHOSPHATE 900 MG/50ML IV SOLN
900.0000 mg | INTRAVENOUS | Status: AC
  Administered 2020-08-26: 15:00:00 900 mg via INTRAVENOUS

## 2020-08-26 MED ORDER — ONDANSETRON HCL 4 MG/2ML IJ SOLN
INTRAMUSCULAR | Status: AC
  Filled 2020-08-26: qty 2

## 2020-08-26 MED ORDER — IBUPROFEN 800 MG PO TABS
800.0000 mg | ORAL_TABLET | Freq: Four times a day (QID) | ORAL | Status: DC
  Administered 2020-08-28 – 2020-08-29 (×5): 800 mg via ORAL
  Filled 2020-08-26 (×6): qty 1

## 2020-08-26 MED ORDER — OXYCODONE HCL 5 MG/5ML PO SOLN
5.0000 mg | Freq: Once | ORAL | Status: DC | PRN
Start: 2020-08-26 — End: 2020-08-26

## 2020-08-26 MED ORDER — MORPHINE SULFATE (PF) 0.5 MG/ML IJ SOLN
INTRAMUSCULAR | Status: DC | PRN
  Administered 2020-08-26: .15 mg via INTRATHECAL

## 2020-08-26 MED ORDER — OXYCODONE-ACETAMINOPHEN 5-325 MG PO TABS: 1.0000 | ORAL_TABLET | ORAL | Status: DC | PRN

## 2020-08-26 MED ORDER — OXYTOCIN-SODIUM CHLORIDE 30-0.9 UT/500ML-% IV SOLN
2.5000 [IU]/h | INTRAVENOUS | Status: AC
  Administered 2020-08-27: 2.5 [IU]/h via INTRAVENOUS
  Filled 2020-08-26: qty 500

## 2020-08-26 MED ORDER — STERILE WATER FOR IRRIGATION IR SOLN
Status: DC | PRN
  Administered 2020-08-26: 1

## 2020-08-26 MED ORDER — MENTHOL 3 MG MT LOZG: 1.0000 | LOZENGE | OROMUCOSAL | Status: DC | PRN

## 2020-08-26 MED ORDER — SOD CITRATE-CITRIC ACID 500-334 MG/5ML PO SOLN
30.0000 mL | ORAL | Status: DC | PRN
  Administered 2020-08-26: 14:00:00 30 mL via ORAL
  Filled 2020-08-26: qty 15

## 2020-08-26 MED ORDER — DEXAMETHASONE SODIUM PHOSPHATE 10 MG/ML IJ SOLN
INTRAMUSCULAR | Status: AC
  Filled 2020-08-26: qty 1

## 2020-08-26 MED ORDER — DEXAMETHASONE SODIUM PHOSPHATE 10 MG/ML IJ SOLN
INTRAMUSCULAR | Status: DC | PRN
  Administered 2020-08-26: 10 mg via INTRAVENOUS

## 2020-08-26 MED ORDER — COCONUT OIL OIL: 1.0000 "application " | TOPICAL_OIL | Status: DC | PRN

## 2020-08-26 MED ORDER — OXYTOCIN-SODIUM CHLORIDE 30-0.9 UT/500ML-% IV SOLN
INTRAVENOUS | Status: DC | PRN
  Administered 2020-08-26: 750 mL via INTRAVENOUS

## 2020-08-26 MED ORDER — SIMETHICONE 80 MG PO CHEW
80.0000 mg | CHEWABLE_TABLET | ORAL | Status: DC
  Administered 2020-08-29: 01:00:00 80 mg via ORAL
  Filled 2020-08-26 (×3): qty 1

## 2020-08-26 MED ORDER — NALOXONE HCL 4 MG/10ML IJ SOLN
1.0000 ug/kg/h | INTRAVENOUS | Status: DC | PRN
  Filled 2020-08-26: qty 5

## 2020-08-26 MED ORDER — SODIUM CHLORIDE 0.9% FLUSH: 3.0000 mL | INTRAVENOUS | Status: DC | PRN

## 2020-08-26 MED ORDER — LIDOCAINE HCL (PF) 1 % IJ SOLN: 30.0000 mL | INTRAMUSCULAR | Status: DC | PRN

## 2020-08-26 MED ORDER — OXYTOCIN-SODIUM CHLORIDE 30-0.9 UT/500ML-% IV SOLN: 2.5000 [IU]/h | INTRAVENOUS | Status: DC

## 2020-08-26 MED ORDER — SCOPOLAMINE 1 MG/3DAYS TD PT72
1.0000 | MEDICATED_PATCH | Freq: Once | TRANSDERMAL | Status: DC
  Administered 2020-08-26: 23:00:00 1.5 mg via TRANSDERMAL
  Filled 2020-08-26: qty 1

## 2020-08-26 MED ORDER — ONDANSETRON HCL 4 MG/2ML IJ SOLN: 4.0000 mg | Freq: Four times a day (QID) | INTRAMUSCULAR | Status: DC | PRN

## 2020-08-26 MED ORDER — KETOROLAC TROMETHAMINE 30 MG/ML IJ SOLN: 30.0000 mg | Freq: Four times a day (QID) | INTRAMUSCULAR | Status: DC | PRN

## 2020-08-26 MED ORDER — BUPIVACAINE IN DEXTROSE 0.75-8.25 % IT SOLN
INTRATHECAL | Status: DC | PRN
  Administered 2020-08-26: 1.7 mL via INTRATHECAL

## 2020-08-26 MED ORDER — MISOPROSTOL 25 MCG QUARTER TABLET: 25.0000 ug | ORAL_TABLET | ORAL | Status: DC | PRN

## 2020-08-26 MED ORDER — WITCH HAZEL-GLYCERIN EX PADS: 1.0000 "application " | MEDICATED_PAD | CUTANEOUS | Status: DC | PRN

## 2020-08-26 MED ORDER — SIMETHICONE 80 MG PO CHEW: 80.0000 mg | CHEWABLE_TABLET | ORAL | Status: DC | PRN

## 2020-08-26 MED ORDER — DIPHENHYDRAMINE HCL 50 MG/ML IJ SOLN: 12.5000 mg | INTRAMUSCULAR | Status: DC | PRN

## 2020-08-26 MED ORDER — OXYCODONE HCL 5 MG PO TABS
5.0000 mg | ORAL_TABLET | Freq: Once | ORAL | Status: DC | PRN
Start: 2020-08-26 — End: 2020-08-26

## 2020-08-26 MED ORDER — ACETAMINOPHEN 325 MG PO TABS: 650.0000 mg | ORAL_TABLET | ORAL | Status: DC | PRN

## 2020-08-26 MED ORDER — KETOROLAC TROMETHAMINE 30 MG/ML IJ SOLN
30.0000 mg | Freq: Four times a day (QID) | INTRAMUSCULAR | Status: DC | PRN
  Administered 2020-08-26: 30 mg via INTRAVENOUS

## 2020-08-26 MED ORDER — ENOXAPARIN SODIUM 60 MG/0.6ML ~~LOC~~ SOLN
60.0000 mg | SUBCUTANEOUS | Status: DC
  Administered 2020-08-27: 13:00:00 60 mg via SUBCUTANEOUS
  Filled 2020-08-26: qty 0.6

## 2020-08-26 MED ORDER — OXYTOCIN BOLUS FROM INFUSION: 333.0000 mL | Freq: Once | INTRAVENOUS | Status: DC

## 2020-08-26 MED ORDER — PHENYLEPHRINE HCL-NACL 20-0.9 MG/250ML-% IV SOLN
INTRAVENOUS | Status: AC
  Filled 2020-08-26: qty 250

## 2020-08-26 MED ORDER — NALBUPHINE HCL 10 MG/ML IJ SOLN
5.0000 mg | Freq: Once | INTRAMUSCULAR | Status: AC | PRN
Start: 2020-08-26 — End: 2020-08-26
  Administered 2020-08-26: 5 mg via INTRAVENOUS

## 2020-08-26 MED ORDER — KETOROLAC TROMETHAMINE 30 MG/ML IJ SOLN
30.0000 mg | Freq: Four times a day (QID) | INTRAMUSCULAR | Status: AC
  Administered 2020-08-26 – 2020-08-27 (×4): 30 mg via INTRAVENOUS
  Filled 2020-08-26 (×4): qty 1

## 2020-08-26 MED ORDER — SODIUM CHLORIDE 0.9 % IV SOLN: 10.0000 mL/h | Freq: Once | INTRAVENOUS | Status: DC

## 2020-08-26 MED ORDER — TETANUS-DIPHTH-ACELL PERTUSSIS 5-2.5-18.5 LF-MCG/0.5 IM SUSY: 0.5000 mL | PREFILLED_SYRINGE | Freq: Once | INTRAMUSCULAR | Status: DC

## 2020-08-26 MED ORDER — PHENYLEPHRINE HCL-NACL 20-0.9 MG/250ML-% IV SOLN
INTRAVENOUS | Status: DC | PRN
  Administered 2020-08-26: 60 ug/min via INTRAVENOUS

## 2020-08-26 MED ORDER — FENTANYL CITRATE (PF) 100 MCG/2ML IJ SOLN
25.0000 ug | INTRAMUSCULAR | Status: DC | PRN
Start: 2020-08-26 — End: 2020-08-26

## 2020-08-26 MED ORDER — FENTANYL CITRATE (PF) 100 MCG/2ML IJ SOLN
INTRAMUSCULAR | Status: DC | PRN
  Administered 2020-08-26: 15 ug via INTRATHECAL

## 2020-08-26 MED ORDER — PHENYLEPHRINE HCL (PRESSORS) 10 MG/ML IV SOLN
INTRAVENOUS | Status: DC | PRN
  Administered 2020-08-26: 100 ug via INTRAVENOUS
  Administered 2020-08-26: 120 ug via INTRAVENOUS

## 2020-08-26 MED ORDER — PRENATAL MULTIVITAMIN CH
1.0000 | ORAL_TABLET | Freq: Every day | ORAL | Status: DC
  Administered 2020-08-27 – 2020-08-28 (×2): 1 via ORAL
  Filled 2020-08-26 (×3): qty 1

## 2020-08-26 MED ORDER — ONDANSETRON HCL 4 MG/2ML IJ SOLN
INTRAMUSCULAR | Status: DC | PRN
  Administered 2020-08-26: 4 mg via INTRAVENOUS

## 2020-08-26 MED ORDER — ONDANSETRON HCL 4 MG/2ML IJ SOLN: 4.0000 mg | Freq: Three times a day (TID) | INTRAMUSCULAR | Status: DC | PRN

## 2020-08-26 MED ORDER — NALBUPHINE HCL 10 MG/ML IJ SOLN
5.0000 mg | INTRAMUSCULAR | Status: DC | PRN
Start: 2020-08-26 — End: 2020-08-29
  Administered 2020-08-27 (×3): 5 mg via INTRAVENOUS
  Filled 2020-08-26 (×4): qty 1

## 2020-08-26 MED ORDER — MEPERIDINE HCL 25 MG/ML IJ SOLN: 6.2500 mg | INTRAMUSCULAR | Status: DC | PRN

## 2020-08-26 MED ORDER — IBUPROFEN 800 MG PO TABS: 800.0000 mg | ORAL_TABLET | Freq: Four times a day (QID) | ORAL | Status: DC

## 2020-08-26 MED ORDER — OXYCODONE-ACETAMINOPHEN 5-325 MG PO TABS: 2.0000 | ORAL_TABLET | ORAL | Status: DC | PRN

## 2020-08-26 MED ORDER — SODIUM CHLORIDE 0.9 % IR SOLN
Status: DC | PRN
  Administered 2020-08-26: 1

## 2020-08-26 MED ORDER — MORPHINE SULFATE (PF) 0.5 MG/ML IJ SOLN
INTRAMUSCULAR | Status: AC
  Filled 2020-08-26: qty 10

## 2020-08-26 MED ORDER — METHYLERGONOVINE MALEATE 0.2 MG/ML IJ SOLN
INTRAMUSCULAR | Status: DC | PRN
  Administered 2020-08-26: .2 mg via INTRAMUSCULAR

## 2020-08-26 MED ORDER — LACTATED RINGERS IV SOLN: 500.0000 mL | INTRAVENOUS | Status: DC | PRN

## 2020-08-26 MED ORDER — SENNOSIDES-DOCUSATE SODIUM 8.6-50 MG PO TABS
2.0000 | ORAL_TABLET | ORAL | Status: DC
  Administered 2020-08-26 – 2020-08-29 (×3): 2 via ORAL
  Filled 2020-08-26 (×3): qty 2

## 2020-08-26 MED ORDER — NALBUPHINE HCL 10 MG/ML IJ SOLN
5.0000 mg | Freq: Once | INTRAMUSCULAR | Status: AC | PRN
Start: 2020-08-26 — End: 2020-08-26

## 2020-08-26 MED ORDER — CLINDAMYCIN PHOSPHATE 900 MG/50ML IV SOLN
INTRAVENOUS | Status: AC
  Filled 2020-08-26: qty 50

## 2020-08-26 MED ORDER — GENTAMICIN SULFATE 40 MG/ML IJ SOLN
5.0000 mg/kg | INTRAVENOUS | Status: AC
  Administered 2020-08-26: 15:00:00 410 mg via INTRAVENOUS
  Filled 2020-08-26: qty 10.25

## 2020-08-26 MED ORDER — DIPHENHYDRAMINE HCL 25 MG PO CAPS
25.0000 mg | ORAL_CAPSULE | ORAL | Status: DC | PRN
  Filled 2020-08-26: qty 1

## 2020-08-26 MED ORDER — TRANEXAMIC ACID-NACL 1000-0.7 MG/100ML-% IV SOLN
INTRAVENOUS | Status: DC | PRN
  Administered 2020-08-26 (×2): 1000 mg via INTRAVENOUS

## 2020-08-26 MED ORDER — ACETAMINOPHEN 500 MG PO TABS
1000.0000 mg | ORAL_TABLET | Freq: Three times a day (TID) | ORAL | Status: DC
  Administered 2020-08-26 – 2020-08-29 (×7): 1000 mg via ORAL
  Filled 2020-08-26 (×8): qty 2

## 2020-08-26 MED ORDER — DIBUCAINE (PERIANAL) 1 % EX OINT: 1.0000 "application " | TOPICAL_OINTMENT | CUTANEOUS | Status: DC | PRN

## 2020-08-26 MED ORDER — LACTATED RINGERS IV BOLUS: 1000.0000 mL | Freq: Once | INTRAVENOUS | Status: DC

## 2020-08-26 MED ORDER — METHYLERGONOVINE MALEATE 0.2 MG/ML IJ SOLN
INTRAMUSCULAR | Status: AC
  Filled 2020-08-26: qty 1

## 2020-08-26 MED ORDER — OXYTOCIN-SODIUM CHLORIDE 30-0.9 UT/500ML-% IV SOLN
INTRAVENOUS | Status: AC
  Filled 2020-08-26: qty 500

## 2020-08-26 SURGICAL SUPPLY — 42 items
BARRIER ADHS 3X4 INTERCEED (GAUZE/BANDAGES/DRESSINGS) ×3 IMPLANT
BENZOIN TINCTURE PRP APPL 2/3 (GAUZE/BANDAGES/DRESSINGS) ×3 IMPLANT
CHLORAPREP W/TINT 26ML (MISCELLANEOUS) ×3 IMPLANT
CLAMP CORD UMBIL (MISCELLANEOUS) IMPLANT
CLIP FILSHIE TUBAL LIGA STRL (Clip) ×6 IMPLANT
CLOSURE WOUND 1/2 X4 (GAUZE/BANDAGES/DRESSINGS) ×1
CLOTH BEACON ORANGE TIMEOUT ST (SAFETY) ×3 IMPLANT
DRSG OPSITE POSTOP 4X10 (GAUZE/BANDAGES/DRESSINGS) ×3 IMPLANT
ELECT REM PT RETURN 9FT ADLT (ELECTROSURGICAL) ×3
ELECTRODE REM PT RTRN 9FT ADLT (ELECTROSURGICAL) ×1 IMPLANT
EXTRACTOR VACUUM M CUP 4 TUBE (SUCTIONS) IMPLANT
EXTRACTOR VACUUM M CUP 4' TUBE (SUCTIONS)
GLOVE BIOGEL PI IND STRL 7.0 (GLOVE) ×2 IMPLANT
GLOVE BIOGEL PI IND STRL 7.5 (GLOVE) ×2 IMPLANT
GLOVE BIOGEL PI INDICATOR 7.0 (GLOVE) ×4
GLOVE BIOGEL PI INDICATOR 7.5 (GLOVE) ×4
GLOVE ECLIPSE 7.5 STRL STRAW (GLOVE) ×3 IMPLANT
GOWN STRL REUS W/TWL LRG LVL3 (GOWN DISPOSABLE) ×9 IMPLANT
HEMOSTAT ARISTA ABSORB 3G PWDR (HEMOSTASIS) ×6 IMPLANT
KIT ABG SYR 3ML LUER SLIP (SYRINGE) IMPLANT
NEEDLE HYPO 25X5/8 SAFETYGLIDE (NEEDLE) IMPLANT
NS IRRIG 1000ML POUR BTL (IV SOLUTION) ×3 IMPLANT
PACK C SECTION WH (CUSTOM PROCEDURE TRAY) ×3 IMPLANT
PAD ABD DERMACEA PRESS 5X9 (GAUZE/BANDAGES/DRESSINGS) ×3 IMPLANT
PAD OB MATERNITY 4.3X12.25 (PERSONAL CARE ITEMS) ×3 IMPLANT
PENCIL SMOKE EVAC W/HOLSTER (ELECTROSURGICAL) ×3 IMPLANT
RTRCTR C-SECT PINK 25CM LRG (MISCELLANEOUS) ×3 IMPLANT
SPONGE COVER 4X4 NONSTERILE (GAUZE/BANDAGES/DRESSINGS) ×6 IMPLANT
STAPLER VISISTAT 35W (STAPLE) ×3 IMPLANT
STRIP CLOSURE SKIN 1/2X4 (GAUZE/BANDAGES/DRESSINGS) ×2 IMPLANT
SUT PLAIN 0 NONE (SUTURE) ×3 IMPLANT
SUT PLAIN 2 0 XLH (SUTURE) ×3 IMPLANT
SUT VIC AB 0 CT1 36 (SUTURE) ×3 IMPLANT
SUT VIC AB 0 CTX 36 (SUTURE) ×18
SUT VIC AB 0 CTX36XBRD ANBCTRL (SUTURE) ×9 IMPLANT
SUT VIC AB 2-0 CT1 27 (SUTURE) ×2
SUT VIC AB 2-0 CT1 TAPERPNT 27 (SUTURE) ×1 IMPLANT
SUT VIC AB 4-0 KS 27 (SUTURE) ×3 IMPLANT
TOWEL OR 17X24 6PK STRL BLUE (TOWEL DISPOSABLE) ×3 IMPLANT
TRAY FOLEY W/BAG SLVR 14FR LF (SET/KITS/TRAYS/PACK) ×3 IMPLANT
WATER STERILE IRR 1000ML POUR (IV SOLUTION) ×3 IMPLANT
YANKAUER SUCT BULB TIP NO VENT (SUCTIONS) ×3 IMPLANT

## 2020-08-26 NOTE — H&P (Signed)
Stefanie Hale is a 39 y.o. G40P2002 female at [redacted]w[redacted]d presenting for IOL for history of uterine surgery (myomectomy and ablation in 2019). She denies contractions or any current vaginal bleeding, she has had one episode of bright red vaginal bleeding that resolved on its own. No evidence of placental malformation/accreta/previa on ultrasounds. She desires to breastfeed and wants a BTL. OB History    Gravida  3   Para  2   Term  2   Preterm      AB      Living  2     SAB      IAB      Ectopic      Multiple      Live Births  2          Past Medical History:  Diagnosis Date  . Anemia   . Childhood asthma    no inhaler,  no problems as adult  . Complication of anesthesia   . SVD (spontaneous vaginal delivery)    x 2   Past Surgical History:  Procedure Laterality Date  . HYSTEROSCOPY WITH D & C Bilateral 01/02/2018   Procedure: DILATATION AND CURETTAGE /HYSTEROSCOPY MYOSURE MYOMECTOMY AND NOVASURE ENDOMETRIAL ABLATION;  Surgeon: Lavonia Drafts, MD;  Location: [facility];  Service: Gynecology;  Laterality: Bilateral;  . TONSILLECTOMY     Family History: family history includes Arthritis in an other family member; Cancer in her mother; Congestive Heart Failure in her mother; Diabetes in her mother; Hypertension in her mother and another family member; Lupus in her mother; Stroke in an other family member. Social History:  reports that she has never smoked. She has never used smokeless tobacco. She reports that she does not drink alcohol and does not use drugs.    Maternal Diabetes: No Genetic Screening: Normal Maternal Ultrasounds/Referrals: Normal Fetal Ultrasounds or other Referrals:  None Maternal Substance Abuse:  No Significant Maternal Medications:  None Significant Maternal Lab Results:  Group B Strep negative Other Comments:  None  Review of Systems  Constitutional: Negative for fatigue and fever.  HENT: Negative for congestion and sore throat.    Eyes: Negative for visual disturbance.  Respiratory: Negative for cough and shortness of breath.   Gastrointestinal: Negative for constipation and vomiting.  Genitourinary: Negative for vaginal bleeding.  Neurological: Negative for dizziness, syncope and headaches.  All other systems reviewed and are negative.  Maternal Medical History:  Fetal activity: Perceived fetal activity is normal.   Last perceived fetal movement was within the past hour.    Prenatal complications: Bleeding (one episode of bleeding ).   No PIH.   IUGR: 75%ile. Oligohydramnios: "subjectively low-normal on last U/S"   Prenatal Complications - Diabetes: none.    Dilation: 1 Effacement (%): 50 Exam by:: Gaylan Gerold, CNM Blood pressure 119/78, pulse 87, temperature 98.8 F (37.1 C), temperature source Oral, resp. rate 19, height 5\' 7"  (1.702 m), weight 252 lb 4.8 oz (114.4 kg), last menstrual period 11/27/2019. Maternal Exam:  Uterine Assessment: Contraction strength is mild.  Contraction frequency is irregular.   Abdomen: Patient reports no abdominal tenderness. Fetal presentation: no presenting part Transverse lie confirmed with Leopold's and bedside U/S. Pt stated baby had been transverse and was vertex at last appt, but she felt her move back up within the past couple of days.  Introitus: Normal vulva. Normal vagina.  Dilation: 1 Effacement (%): 50 Cervical Position: Posterior Presentation: Transverse (verified by bedside US) Exam by:: Gaylan Gerold, CNM   Pelvis:  adequate for delivery.   Cervix: Cervix evaluated by digital exam.     Fetal Exam Fetal Monitor Review: Mode: ultrasound.   Baseline rate: 150.  Variability: moderate (6-25 bpm).   Pattern: accelerations present and variable decelerations.    Fetal State Assessment: Category I - tracings are normal.     Physical Exam Vitals and nursing note reviewed. Exam conducted with a chaperone present.  Constitutional:      General: She  is not in acute distress.    Appearance: Normal appearance. She is normal weight. She is not ill-appearing.  HENT:     Head: Normocephalic.     Mouth/Throat:     Mouth: Mucous membranes are moist.  Eyes:     Pupils: Pupils are equal, round, and reactive to light.  Cardiovascular:     Rate and Rhythm: Normal rate and regular rhythm.     Pulses: Normal pulses.     Heart sounds: Normal heart sounds.  Pulmonary:     Effort: Pulmonary effort is normal.     Breath sounds: Normal breath sounds.  Abdominal:     General: Bowel sounds are normal.     Palpations: Abdomen is soft.     Comments: Gravid   Genitourinary:    General: Normal vulva.  Musculoskeletal:        General: Normal range of motion.  Skin:    General: Skin is warm and dry.     Capillary Refill: Capillary refill takes less than 2 seconds.  Neurological:     Mental Status: She is alert and oriented to person, place, and time.  Psychiatric:        Mood and Affect: Mood normal.        Behavior: Behavior normal.        Thought Content: Thought content normal.        Judgment: Judgment normal.     Prenatal labs: ABO, Rh: --/--/B POS (12/15 0835) Antibody: NEG (12/15 0835) Rubella: <0.90 (06/14 1147) RPR: Non Reactive (10/12 0919)  HBsAg: Negative (06/14 1147)  HIV: Non Reactive (10/12 0919)  GBS: Negative/-- (11/24 1413)   Assessment/Plan: Counseling on risks/benefits of version vs CS for transverse lie - pt, FOB, and MGM expressed concerns about risk of bleeding during version due to her episode of bleeding during the pregnancy and uterine surgical history. Dr. Si Raider further counseled pt and she would like to proceed with pLTCS. She has been NPO since 0630 this morning.  Prepare for OR, CS at 1500 today.   Gabriel Carina 08/26/2020, 10:40 AM

## 2020-08-26 NOTE — Transfer of Care (Signed)
Immediate Anesthesia Transfer of Care Note  Patient: Stefanie Hale  Procedure(s) Performed: CESAREAN SECTION WITH BILATERAL TUBAL LIGATION (N/A )  Patient Location: PACU  Anesthesia Type:Spinal  Level of Consciousness: awake, alert  and oriented  Airway & Oxygen Therapy: Patient Spontanous Breathing  Post-op Assessment: Report given to RN and Post -op Vital signs reviewed and stable  Post vital signs: Reviewed and stable  Last Vitals:  Vitals Value Taken Time  BP 109/97 08/26/20 1700  Temp    Pulse 65 08/26/20 1700  Resp 13 08/26/20 1704  SpO2 88 % 08/26/20 1700  Vitals shown include unvalidated device data.  Last Pain:  Vitals:   08/26/20 1215  TempSrc: Axillary  PainSc:          Complications: No complications documented.

## 2020-08-26 NOTE — Anesthesia Postprocedure Evaluation (Signed)
Anesthesia Post Note  Patient: Quantavia L Larkey  Procedure(s) Performed: CESAREAN SECTION WITH BILATERAL TUBAL LIGATION (N/A )     Patient location during evaluation: [facility] Anesthesia Type: Spinal Level of consciousness: oriented and awake and alert Pain management: pain level controlled Vital Signs Assessment: post-procedure vital signs reviewed and stable Respiratory status: spontaneous breathing and respiratory function stable Cardiovascular status: blood pressure returned to baseline and stable Postop Assessment: no headache, no backache, no apparent nausea or vomiting, spinal receding and patient able to bend at knees Anesthetic complications: no   No complications documented.  Last Vitals:  Vitals:   08/26/20 1745 08/26/20 1800  BP: (!) 92/57 (!) 90/56  Pulse: 63 65  Resp: 13 15  Temp:  36.6 C  SpO2: 97% 100%    Last Pain:  Vitals:   08/26/20 1800  TempSrc: Oral  PainSc:                  Merlinda Frederick

## 2020-08-26 NOTE — Progress Notes (Signed)
Patient here for iol @ term for history endometrial ablation. Hx transverse lie and found to be transverse today with head to maternal left, back up. No abnormal placentation seen on antenatal ultrasounds. Category 1 tracing.  We discussed at length the risks and benefits of attempting ECV, breech delivery, and proceeding directly to cesarean section. The patient wishes to proceed with cesarean delivery  The risks of cesarean section were discussed with the patient including but were not limited to: bleeding which may require transfusion or reoperation; infection which may require antibiotics; injury to bowel, bladder, ureters or other surrounding organs; injury to the fetus; need for additional procedures including hysterectomy in the event of a life-threatening hemorrhage; placental abnormalities wth subsequent pregnancies, incisional problems, thromboembolic phenomenon and other postoperative/anesthesia complications.  Patient also desires permanent sterilization.  Other reversible forms of contraception were discussed with patient; she declines all other modalities. Risks of procedure discussed with patient including but not limited to: risk of regret, permanence of method, bleeding, infection, injury to surrounding organs and need for additional procedures.  Failure risk of 1-2% with increased risk of ectopic gestation if pregnancy occurs was also discussed with patient.  The patient concurred with the proposed plan, giving informed written consent for the procedures.  Patient has been NPO since 06:30 she will remain NPO for procedure. Anesthesia and OR aware.  Preoperative prophylactic antibiotics and SCDs ordered on call to the OR.  To OR when ready.

## 2020-08-26 NOTE — Anesthesia Procedure Notes (Signed)
Spinal  Patient location during procedure: OR Start time: 08/26/2020 2:55 PM End time: 08/26/2020 2:57 PM Staffing Performed: anesthesiologist  Anesthesiologist: Albertha Ghee, MD Preanesthetic Checklist Completed: patient identified, IV checked, risks and benefits discussed, surgical consent, monitors and equipment checked, pre-op evaluation and timeout performed Spinal Block Patient position: sitting Prep: DuraPrep Patient monitoring: cardiac monitor, continuous pulse ox and blood pressure Approach: midline Location: L3-4 Injection technique: single-shot Needle Needle type: Pencan  Needle gauge: 24 G Needle length: 9 cm Assessment Sensory level: T10 Additional Notes Functioning IV was confirmed and monitors were applied. Sterile prep and drape, including hand hygiene and sterile gloves were used. The patient was positioned and the spine was prepped. The skin was anesthetized with lidocaine.  Free flow of clear CSF was obtained prior to injecting local anesthetic into the CSF.  The spinal needle aspirated freely following injection.  The needle was carefully withdrawn.  The patient tolerated the procedure well.

## 2020-08-26 NOTE — Op Note (Signed)
Cesarean Section Operative Report  Stefanie Hale. [facility]  08/26/2020  Indications: transverse presentation  Pre-operative Diagnosis: transverse presentation, undesired fertility   Post-operative Diagnosis: Same, classical incision (inverted T into contractile portion of uterus), postpartum hemorrhage, fibroid uterus  Surgeon: Surgeon(s) and Role:    * Ebon Ketchum, Ailene Rud, MD - Primary    * Griffin Basil, MD - Assisting    * Marsala, Placido Sou, MD - Assisting   Attending Attestation: I was present and scrubbed for the entire procedure.    Anesthesia: spinal    Estimated Blood Loss: 2360 ml  Total IV Fluids: 3000 ml LR, 1 U PRBCs and 1 U FFP  Urine Output:: 150 ml clear yellow urine  Specimens: none  Findings: Viable female infant in transverse (head to maternal left presentation; Apgars pending; weight pending g; arterial cord pH 7.2; clear amniotic fluid; intact placenta with three vessel cord; normal uterus, fallopian tubes and ovaries bilaterally.  Baby condition / location:  Couplet care / Skin to Skin   Complications: DIFFICULT FETAL EXTRACTION requiring inverted T incision into contractile portion of the uterus, maternal hemorrhage  Indications: Stefanie Hale is a 39 y.o. [id number] with an IUP [redacted]w[redacted]d presenting with iol for history endometrial ablation. Fetus found to be transverse, mother declined ECV.  The risks, benefits, complications, treatment options, and exected outcomes were discussed with the patient . The patient dwith the proposed plan, giving informed consent. identified as Stefanie Hale and the procedure verified as C-Section Delivery.  Procedure Details:  The patient was taken back to the operative suite where spinal anesthesia was placed.  A time out was held and the above information confirmed.   After induction of anesthesia, the patient was draped and prepped in the usual sterile manner and placed in a dorsal supine position with a  leftward tilt. A Pfannenstiel incision was made and carried down through the subcutaneous tissue to the fascia. Fascial incision was made and bluntly extended transversely. The fascia was separated from the underlying rectus tissue superiorly and inferiorly. The peritoneum was identified and bluntly entered and extended longitudinally. Alexis retractor was placed. An anterior uterine fibroid in the mid position of the uterus several centimeters in diameter was observed.  A low transverse uterine incision was made and extended bluntly. Brisk bleeding noted. Presenting part was fetal arm. After considerable effort and an inverted T incision with bandage scissors, the vertex was brought to the hysterotomy and the infant delivered in standard fashion. Nuchal x2. Cord immediately clamped and cut. Cord ph was sent. The placenta was removed Intact and appeared normal. The uterine outline, tubes and ovaries appeared normal save for the aforementioned anterior fibroid. Attention was first taken to the vertical incision which was closed in 3 layers of running 0 vicryl. Continued bleeding was noted at the apex and middle portion of the incision. At this point an EBL of about 2 L was announced. TXA was given (and repeated an hour later). Methergine was also given and a DIC panel ordered. The vertical incision required may figure of 8 0 vicryl sutures before hemostasis was achieved. Arista and innterceed. The low transverse uterine incision was closed with running locked sutures of 0Vicryl with an imbricating layer of the same. Given persistent oozing 1 unit of PRBCs and FFP was ordered. The right fallopian tube was identified and followed to the fimbria. A flishie clip was placed 2 cm from the cornua. A similar process was followed with the left fallopian tube. The uterus  was placed back in the abdomen and hemostasis was observed. The peritoneum was closed with 2-0 vicryl. The rectus muscles were examined and hemostasis  observed. The fascia was then reapproximated with running sutures of 0Vicryl. The subcuticular tissue was closed with 2-0 plain gut. The skin was closed with staples.  Instrument, sponge, and needle counts were correct prior the abdominal closure and were correct at the conclusion of the case.     Disposition: PACU - hemodynamically stable.   Maternal Condition: stable       Signed: Ennis Hale 08/26/2020 4:52 PM

## 2020-08-26 NOTE — Anesthesia Preprocedure Evaluation (Signed)
Anesthesia Evaluation  Patient identified by MRN, date of birth, ID band Patient awake    Reviewed: Allergy & Precautions, H&P , NPO status , Patient's Chart, lab work & pertinent test results  Airway Mallampati: II   Neck ROM: full    Dental   Pulmonary asthma ,    breath sounds clear to auscultation       Cardiovascular negative cardio ROS   Rhythm:regular Rate:Normal     Neuro/Psych    GI/Hepatic   Endo/Other    Renal/GU      Musculoskeletal   Abdominal   Peds  Hematology  (+) Blood dyscrasia, anemia ,   Anesthesia Other Findings   Reproductive/Obstetrics (+) Pregnancy                             Anesthesia Physical Anesthesia Plan  ASA: II  Anesthesia Plan: Spinal   Post-op Pain Management:    Induction: Intravenous  PONV Risk Score and Plan: 2 and Treatment may vary due to age or medical condition  Airway Management Planned: Simple Face Mask  Additional Equipment:   Intra-op Plan:   Post-operative Plan:   Informed Consent: I have reviewed the patients History and Physical, chart, labs and discussed the procedure including the risks, benefits and alternatives for the proposed anesthesia with the patient or authorized representative who has indicated his/her understanding and acceptance.       Plan Discussed with: CRNA, Anesthesiologist and Surgeon  Anesthesia Plan Comments:         Anesthesia Quick Evaluation

## 2020-08-26 NOTE — Discharge Summary (Signed)
Postpartum Discharge Summary   Patient Name: Stefanie Hale DOB: Apr 08, 1981 MRN: [id number]  Date of admission: 08/26/2020 Delivery date:08/26/2020  Delivering provider: Laurey Arrow [place]  Date of discharge: 08/29/2020  Admitting diagnosis: Encounter for induction of labor [Z34.90] Intrauterine pregnancy: [redacted]w[redacted]d    Secondary diagnosis:  Active Problems:   Fibroids   Supervision of high risk pregnancy, antepartum   AMA (advanced maternal age) multigravida 35+   Obesity affecting pregnancy, antepartum   History of surgery involving uterine cervix, antepartum   History of cesarean section, classical   History of tubal ligation  Additional problems: Postpartum Hemorrhage    Discharge diagnosis: Term Pregnancy Delivered and PPH                                              Post partum procedures:postpartum tubal ligation Augmentation: N/A Complications: Postpartum hemorrhage   Hospital course: Induction of Labor With Vaginal Delivery   39y.o. yo G3P2002 at 33w0das admitted to the hospital 08/26/2020 for induction of labor.  On arrival, patient was found to fetus in transverse position. [facility]. Patient had a primary cesarean section for transverse orientation. Course was complicated by difficult extraction of newborn requiring extension into T incision. PPH of 2L requiring 1 unit pRBC, 1 unit FFP, TXA x2, Methergine x1.    Membrane Rupture Time/Date: 3:21 PM ,08/26/2020   Delivery Method:C-Section, Classical   Details of delivery can be found in separate operative note.  Patient had a routine postpartum course, except for difficulty controlling pain on PPD#2. Toradol IM given and pain improved. Patient is discharged home 08/29/20.  Newborn Data: Birth date:08/26/2020  Birth time:3:25 PM  Gender:Female  Living status:Living  Apgars:2 ,8  Weight:7 lb 2.6 oz (3.249 kg)   Magnesium Sulfate received: No BMZ received:  No Rhophylac:N/A MMR:N/A T-DaP:Given prenatally Flu: No Transfusion:Yes--2u pRBC and 1u FFP  Physical exam  Vitals:   08/28/20 0555 08/28/20 1537 08/28/20 2141 08/29/20 0609  BP: 102/67 115/77 122/77 114/76  Pulse: 76 72 89 72  Resp: 17 20 15 18   Temp: 98.3 F (36.8 C) 98.3 F (36.8 C) 98 F (36.7 C) 98.2 F (36.8 C)  TempSrc: Oral Oral Oral Oral  SpO2: 100%  100% 100%  Weight:      Height:       General: alert, cooperative and no distress Lochia: appropriate Uterine Fundus: firm Incision: Healing well with no significant drainage DVT Evaluation: No evidence of DVT seen on physical exam. Labs: Lab Results  Component Value Date   WBC 10.5 08/28/2020   HGB 8.6 (L) 08/28/2020   HCT 25.0 (L) 08/28/2020   MCV 92.6 08/28/2020   PLT 161 08/28/2020   CMP Latest Ref Rng & Units 08/26/2020  Glucose 70 - 99 mg/dL -  BUN 6 - 20 mg/dL -  Creatinine 0.44 - 1.00 mg/dL 0.69  Sodium 135 - 145 mmol/L -  Potassium 3.5 - 5.1 mmol/L -  Chloride 98 - 111 mmol/L -  CO2 22 - 32 mmol/L -  Calcium 8.9 - 10.3 mg/dL -  Total Protein 6.5 - 8.1 g/dL -  Total Bilirubin 0.3 - 1.2 mg/dL -  Alkaline Phos 38 - 126 U/L -  AST 15 - 41 U/L -  ALT 0 - 44 U/L -   Edinburgh Score: Edinburgh Postnatal Depression Scale Screening Tool  08/27/2020  I have been able to laugh and see the funny side of things. 0  I have looked forward with enjoyment to things. 0  I have blamed myself unnecessarily when things went wrong. 1  I have been anxious or worried for no good reason. 2  I have felt scared or panicky for no good reason. 0  Things have been getting on top of me. 1  I have been so unhappy that I have had difficulty sleeping. 1  I have felt sad or miserable. 1  I have been so unhappy that I have been crying. 1  The thought of harming myself has occurred to me. 0  Edinburgh Postnatal Depression Scale Total 7     After visit meds:  Allergies as of 08/29/2020      Reactions   Influenza Vaccines  Other (See Comments), Swelling   Arm swelling and felt very bad. Arm swelling and felt very bad.   Penicillins Hives   REACTION: hives REACTION: hives      Medication List    STOP taking these medications   aspirin EC 81 MG tablet   metroNIDAZOLE 500 MG tablet Commonly known as: FLAGYL   ondansetron 4 MG disintegrating tablet Commonly known as: Zofran ODT   terconazole 0.4 % vaginal cream Commonly known as: TERAZOL 7     TAKE these medications   acetaminophen 500 MG tablet Commonly known as: TYLENOL Take 2 tablets (1,000 mg total) by mouth every 8 (eight) hours.   coconut oil Oil Apply 1 application topically as needed (nipple pain).   ferrous sulfate 325 (65 FE) MG tablet Take 1 tablet (325 mg total) by mouth every other day.   ibuprofen 800 MG tablet Commonly known as: ADVIL Take 1 tablet (800 mg total) by mouth every 8 (eight) hours as needed.   oxyCODONE 5 MG immediate release tablet Commonly known as: Oxy IR/ROXICODONE Take 1 tablet (5 mg total) by mouth every 6 (six) hours as needed for moderate pain.   PRENATAL VITAMINS PO Take by mouth.        Discharge home in stable condition Infant Feeding: Bottle Infant Disposition:home with mother Discharge instruction: per After Visit Summary and Postpartum booklet. Activity: Advance as tolerated. Pelvic rest for 6 weeks.  Diet: routine diet Future Appointments: Future Appointments  Date Time Provider [place]  09/02/2020  4:15 PM Truett Mainland, DO [facility] None  09/24/2020  1:00 PM Nehemiah Settle Tanna Savoy, DO [facility] None   Follow up Visit:  Please schedule this patient for a In person postpartum visit in 4 weeks with the following provider: MD. Additional Postpartum F/U:Incision check 1 week and staple removal  High risk pregnancy complicated by: hx of endometrial ablation and myomectomy, fibroids, c section w PPH and T incision  Delivery mode:  C-Section, Classical  Anticipated Birth Control:   BTL done PP  Gaylan Gerold, CNM, MSN, [facility] 08/29/20 11:08 AM

## 2020-08-26 NOTE — Lactation Note (Signed)
This note was copied from a baby's chart. Lactation Consultation Note  Patient Name: Stefanie Hale Today's Date: 08/26/2020 Reason for consult: Initial assessment  Initial visit to 36 hours old infant of a P3 mother. Mother reports difficulty breastfeeding her older children and only lasted 1 day. Mother plans to breastfeed this child. Mother states infant has been very sleepy after birth. Encouraged skin to skin and listed benefits for infant. Offered assistance to latch infant. Attempted latch to right breast, football position. Infant is sleepy and uninterested.  Talked to mother about hand expression, demonstrated technique and able to collect ~1 mL. Finger-fed colostrum.    Reviewed with mother average size of a NB stomach. Reviewed colostrum benefits for baby. Reviewed breastfeeding basics. Discussed milk coming to volume. Reviewed newborn behavior, feeding patterns and expectations with mother.   Feeding plan:  1. Breastfeed following hunger cues.  2. Offer breast 8 - 12 times in 24h period to establish good milk supply.   3. Monitor voids and stools as signs of effective milk transfer 4. Encouraged maternal rest, hydration and food intake.  5. Contact Lactation Services or local resources for support, questions or concerns.    All questions answered. Provided [place] brochure and promoted [employer] booklet.    Maternal Data Formula Feeding for Exclusion: No Has patient been taught Hand Expression?: Yes Does the patient have breastfeeding experience prior to this delivery?: Yes  Feeding Feeding Type: Breast Fed  LATCH Score Latch: Repeated attempts needed to sustain latch, nipple held in mouth throughout feeding, stimulation needed to elicit sucking reflex.  Audible Swallowing: None  Type of Nipple: Everted at rest and after stimulation  Comfort (Breast/Nipple): Soft / non-tender  Hold (Positioning): Assistance needed to correctly position infant at breast and maintain  latch.  LATCH Score: 6  Interventions Interventions: Breast feeding basics reviewed;Assisted with latch;Skin to skin;Breast massage;Hand express;Adjust position;Support pillows;Position options;Expressed milk  Lactation Tools Discussed/Used WIC Program: No   Consult Status Consult Status: Follow-up Date: 08/27/20 Follow-up type: In-patient    Jamesetta Greenhalgh A Higuera [place] 08/26/2020, 9:52 PM

## 2020-08-27 ENCOUNTER — Encounter (HOSPITAL_COMMUNITY): Payer: Self-pay | Admitting: Obstetrics and Gynecology

## 2020-08-27 LAB — CBC
HCT: 23.6 % — ABNORMAL LOW (ref 36.0–46.0)
HCT: 22.3 % — ABNORMAL LOW (ref 36.0–46.0)
Hemoglobin: 8.3 g/dL — ABNORMAL LOW (ref 12.0–15.0)
Hemoglobin: 7.7 g/dL — ABNORMAL LOW (ref 12.0–15.0)
MCH: 32.5 pg (ref 26.0–34.0)
MCH: 32.2 pg (ref 26.0–34.0)
MCHC: 35.2 g/dL (ref 30.0–36.0)
MCHC: 34.5 g/dL (ref 30.0–36.0)
MCV: 92.5 fL (ref 80.0–100.0)
MCV: 93.3 fL (ref 80.0–100.0)
Platelets: 164 10*3/uL (ref 150–400)
Platelets: 157 10*3/uL (ref 150–400)
RBC: 2.55 MIL/uL — ABNORMAL LOW (ref 3.87–5.11)
RBC: 2.39 MIL/uL — ABNORMAL LOW (ref 3.87–5.11)
RDW: 14.6 % (ref 11.5–15.5)
RDW: 14.6 % (ref 11.5–15.5)
WBC: 15.2 10*3/uL — ABNORMAL HIGH (ref 4.0–10.5)
WBC: 13.1 10*3/uL — ABNORMAL HIGH (ref 4.0–10.5)
nRBC: 0 % (ref 0.0–0.2)
nRBC: 0 % (ref 0.0–0.2)

## 2020-08-27 LAB — RPR: RPR Ser Ql: NONREACTIVE

## 2020-08-27 LAB — PREPARE RBC (CROSSMATCH)

## 2020-08-27 MED ORDER — VITAMIN C 250 MG PO TABS
250.0000 mg | ORAL_TABLET | ORAL | Status: DC
  Administered 2020-08-27 – 2020-08-29 (×2): 250 mg via ORAL
  Filled 2020-08-27 (×2): qty 1

## 2020-08-27 MED ORDER — SODIUM CHLORIDE 0.9% IV SOLUTION: Freq: Once | INTRAVENOUS | Status: DC

## 2020-08-27 MED ORDER — FERROUS SULFATE 325 (65 FE) MG PO TABS
325.0000 mg | ORAL_TABLET | ORAL | Status: DC
  Administered 2020-08-27 – 2020-08-29 (×2): 325 mg via ORAL
  Filled 2020-08-27 (×2): qty 1

## 2020-08-27 NOTE — Progress Notes (Addendum)
POSTPARTUM PROGRESS NOTE  Subjective: Stefanie Hale is a 39 y.o. G3P3003 POD #1 LTCS/BTL at [redacted]w[redacted]d.  She reports she doing well. No acute events overnight. She denies any problems with ambulating, intake. Denies nausea or vomiting. Foley was removed this morning around 6:30, patient has not voided yet. She has not passed flatus, and has not had a BM yet. Pain is well controlled. Lochia is moderate . Denies lightheadedness or dizziness.  Objective: Blood pressure 101/62, pulse 74, temperature 99.2 F (37.3 C), temperature source Oral, resp. rate 17, height 5\' 7"  (1.702 m), weight 114.4 kg, last menstrual period 11/27/2019, SpO2 100 %, unknown if currently breastfeeding.  Physical Exam:  General: alert, cooperative and no distress Chest: no respiratory distress Abdomen: soft, mild tenderness to palpation  Incision: dressing c/d/i Uterine Fundus: firm and at level of umbilicus Extremities: No calf swelling or tenderness  no peripheral edema  Recent Labs    08/26/20 2056 08/27/20 0450  HGB 9.8* 8.3*  HCT 29.7* 23.6*    Assessment/Plan: Stefanie Hale is a 39 y.o. [id number] POD#1 LTCS/BTL at [redacted]w[redacted]d. She is subjectively doing well this morning.  Routine Postpartum Care: Doing well, pain well-controlled.  -- Continue routine care, lactation support  -- Contraception: BTL (done) -- Feeding: breast  # Postpartum Hemorrhage/ Acute Blood Loss Anemia  Intra-op PPH of 2.3L. Received TXA x2, 1 unit of pRBCs and 1 FFP.  Hbg 9.8>8.3. Patient is asymptomatic, but reports moderate lochia (soaking through 2 pads/hr).  --PO Iron 325mg  every other day --Vitamin C 250mg  every other day --Repeat cbc this PM  Dispo: Plan for discharge tomorrow or Saturday.  Jacklynn Bue, Medical Student 08/27/2020 7:50 AM   GME ATTESTATION:  I saw and evaluated the patient. I agree with the findings and the plan of care as documented in the student's note.  Arrie Senate, MD OB Fellow, [place] for [place] 08/27/2020 8:08 AM

## 2020-08-27 NOTE — Lactation Note (Signed)
This note was copied from a baby's chart. Lactation Consultation Note  Patient Name: Stefanie Hale Today's Date: 08/27/2020 baby Stefanie Hinger 28 hours old.  Mom trying to sleep.  RN to let me know when mom ready to be seen   Maternal Data    Feeding    LATCH Score                   Interventions    Lactation Tools Discussed/Used     Consult Status      Rollen Sox 08/27/2020, 12:08 PM

## 2020-08-27 NOTE — Lactation Note (Signed)
This note was copied from a baby's chart. Lactation Consultation Note  Patient Name: Stefanie Hale Today's Date: 08/27/2020 Reason for consult: Follow-up assessment  Baby 32 hours old and mother requested assistance with latching. Reviewed hand expression with drops. Baby latched with intermittent swallows in cross cradle hold. Feed on demand with cues.  Goal 8-12+ times per day after first 24 hrs.  Place baby STS if not cueing.  Suggest mother call if further assistance is needed.   Maternal Data    Feeding Feeding Type: Breast Fed  LATCH Score Latch: Grasps breast easily, tongue down, lips flanged, rhythmical sucking.  Audible Swallowing: A few with stimulation  Type of Nipple: Everted at rest and after stimulation  Comfort (Breast/Nipple): Soft / non-tender  Hold (Positioning): Assistance needed to correctly position infant at breast and maintain latch.  LATCH Score: 8  Interventions Interventions: Breast feeding basics reviewed;Assisted with latch;Skin to skin;Hand express;Adjust position;Support pillows  Lactation Tools Discussed/Used     Consult Status Consult Status: Follow-up Date: 08/27/20 Follow-up type: In-patient    Vivianne Master [facility] 08/27/2020, 1:11 PM

## 2020-08-28 LAB — CBC
HCT: 25 % — ABNORMAL LOW (ref 36.0–46.0)
Hemoglobin: 8.6 g/dL — ABNORMAL LOW (ref 12.0–15.0)
MCH: 31.9 pg (ref 26.0–34.0)
MCHC: 34.4 g/dL (ref 30.0–36.0)
MCV: 92.6 fL (ref 80.0–100.0)
Platelets: 161 10*3/uL (ref 150–400)
RBC: 2.7 MIL/uL — ABNORMAL LOW (ref 3.87–5.11)
RDW: 15.2 % (ref 11.5–15.5)
WBC: 10.5 10*3/uL (ref 4.0–10.5)
nRBC: 0 % (ref 0.0–0.2)

## 2020-08-28 LAB — BPAM FFP
Blood Product Expiration Date: 202112152359
Blood Product Expiration Date: 202112152359
ISSUE DATE / TIME: 202112151615
ISSUE DATE / TIME: 202112151615
Unit Type and Rh: 7300
Unit Type and Rh: 7300

## 2020-08-28 LAB — PREPARE FRESH FROZEN PLASMA: Unit division: 0

## 2020-08-28 NOTE — Progress Notes (Signed)
POSTPARTUM PROGRESS NOTE  Subjective: Stefanie Hale is a 39 y.o. [phone] s/p pLTCS/BTL with T extension at [redacted]w[redacted]d.  She reports she doing well. No acute events overnight. She denies any problems with ambulating, voiding or po intake. Denies nausea or vomiting. She has passed flatus. Pain is moderately controlled.  Lochia is minimal.  Objective: Blood pressure 102/67, pulse 76, temperature 98.3 F (36.8 C), temperature source Oral, resp. rate 17, height 5\' 7"  (1.702 m), weight 114.4 kg, last menstrual period 11/27/2019, SpO2 100 %, unknown if currently breastfeeding.  Physical Exam:  General: alert, cooperative and no distress Chest: no respiratory distress Abdomen: soft, non-tender  Uterine Fundus: firm and at level of umbilicus, pressure dressing clean/dry/intact Extremities: No calf swelling or tenderness  no LE edema  Recent Labs    08/27/20 1443 08/28/20 0049  HGB 7.7* 8.6*  HCT 22.3* 25.0*    Assessment/Plan: Stefanie Hale is a 39 y.o. [id number] s/p pLTCS/BTL with T extension at [redacted]w[redacted]d.  Routine Postpartum Care: Doing well, pain well-controlled.  -- Continue routine care, lactation support  -- Contraception: s/p postpartum BTL -- Feeding: breast -- Anemia s/p PPH: EBL 2.3L s/p TXA x2, methergine, 2u pRBCs, 1u FFP. Initial Hgb 9.8 with nadir of 7.7 s/p OR. Most recently repeat CBC in PM on 12/16 with improvement in Hgb to 8.6. Will continue po iron and continue to monitor.  Dispo: Plan for discharge POD#3. Plan for staple removal in clinic on POD#7.  Randa Ngo, MD OB Fellow, [facility] 08/28/2020 8:55 AM

## 2020-08-28 NOTE — Lactation Note (Signed)
This note was copied from a baby's chart. Lactation Consultation Note  Patient Name: Stefanie Hale Today's Date: 08/28/2020 Reason for consult: Follow-up assessment;Mother's request;Difficult latch;1st time breastfeeding;Term;Infant weight loss  Infant is 39 weeks and  64 hours old. Mom felt infant not getting enough and started formula 12 pm today. Infant on Enfamil 20 cal/oz and given 12-20 ml for last 3 feedings. Infant had one extended feed where she slept. Mom had 2 pacifiers in the bed. [place] reviewed with Mom importance to hold off pacifier use for the first 4 weeks to establish a good latch.   LC noticed a white nipple in the room and switched Mom to slower pace aqua nipple and reviewed paced bottle feeding. LC went over breastfeeding supplementation guide based on hours of age after birth.   LC got Mom to do massage and hand expression, no drops of colostrum noted. [place] set up a manual pump and Mom pumped both breasts for 5 minutes. LC then latched infant, STS, in football on the right breast with signs of swallowing and milk transfer.   Plan 1. To feed infant based on cues 8-12 x in 24 hour period no more than 4 hours without an attempt. Mom to place infant STS and offer both breasts looking for signs of milk transfer.           2. Mom to paced bottle feeding Enfamil 20 cal/oz after each breastfeeding based on supplementation guide reviewed with her.           3. Mom to pump with manual pump q 3hours each breast for 10 minutes. Pump assembly, cleaning and storage reviewed with her.          4. All questions answered at the end of the feeding.

## 2020-08-29 DIAGNOSIS — Z9851 Tubal ligation status: Secondary | ICD-10-CM

## 2020-08-29 MED ORDER — COCONUT OIL OIL: 1.0000 "application " | TOPICAL_OIL | 0 refills | Status: DC | PRN

## 2020-08-29 MED ORDER — ACETAMINOPHEN 500 MG PO TABS: 1000.0000 mg | ORAL_TABLET | Freq: Three times a day (TID) | ORAL | 0 refills | Status: DC

## 2020-08-29 MED ORDER — OXYCODONE HCL 5 MG PO TABS: 5.0000 mg | ORAL_TABLET | Freq: Four times a day (QID) | ORAL | 0 refills | Status: DC | PRN

## 2020-08-29 MED ORDER — IBUPROFEN 800 MG PO TABS: 800.0000 mg | ORAL_TABLET | Freq: Three times a day (TID) | ORAL | 1 refills | Status: DC | PRN

## 2020-08-29 MED ORDER — MEASLES, MUMPS & RUBELLA VAC IJ SOLR
0.5000 mL | Freq: Once | INTRAMUSCULAR | Status: AC
  Administered 2020-08-29: 10:00:00 0.5 mL via SUBCUTANEOUS
  Filled 2020-08-29: qty 0.5

## 2020-08-29 MED ORDER — FERROUS SULFATE 325 (65 FE) MG PO TABS: 325.0000 mg | ORAL_TABLET | ORAL | 1 refills | Status: DC

## 2020-08-29 MED ORDER — KETOROLAC TROMETHAMINE 30 MG/ML IJ SOLN
30.0000 mg | Freq: Once | INTRAMUSCULAR | Status: AC
  Administered 2020-08-29: 10:00:00 30 mg via INTRAVENOUS
  Filled 2020-08-29: qty 1

## 2020-08-29 NOTE — Discharge Instructions (Signed)

## 2020-08-29 NOTE — Lactation Note (Signed)
This note was copied from a baby's chart. Lactation Consultation Note  Patient Name: Stefanie Hale Today's Date: 08/29/2020 Reason for consult: Follow-up assessment  1152 - 1201 LC and Lactation student arrived for a follow up visit for 33 hours old baby Stefanie. Infant is resting in bassinet upon entry. Mother states that breastfeeding is going well with supplementation. She is bringing baby to breast for ~15 minutes (offering both breast) and following with formula for supplementation, ~ 78mL.  She has had two stools since feeding at 4:41am. Stated last feed was at 8:40am (20 minutes). Mom expressed that infant has been waking up to feed every 3 hours.   No current concerns with feeding plan. States that it is working for her.   Reviewed continuing putting baby to breast, lactogenesis II, and continue feeding according to formula guidelines. She had no further questions or concerns at this time. She agrees with this plan. Will follow up per request. Reinforced contacting local resources for support once discharged, if necessary.    Maternal Data Formula Feeding for Exclusion: No  Interventions Interventions: Breast feeding basics reviewed;Expressed milk;Hand pump  Lactation Tools Discussed/Used WIC Program: No (Mom is planning to apply)   Consult Status Consult Status: Complete Date: 08/29/20 Follow-up type: Call as needed   Lajuana Ripple -[place] Student 08/29/2020, 12:04 PM   [place] - Preceptor 08/29/2020, 12:18 PM

## 2020-08-29 NOTE — Progress Notes (Signed)
AVS printed and discharged instructions given to patient. Pt aware of follow-up appointment. Patient instructed to pick up prescribed medications. All questions answered and patient verbalized understanding.

## 2020-08-30 LAB — BPAM RBC
Blood Product Expiration Date: 202201112359
Blood Product Expiration Date: 202201112359
Blood Product Expiration Date: 202201112359
Blood Product Expiration Date: 202201112359
ISSUE DATE / TIME: 202112151536
ISSUE DATE / TIME: 202112151536
ISSUE DATE / TIME: 202112161953
ISSUE DATE / TIME: 202112171053
Unit Type and Rh: 7300
Unit Type and Rh: 7300
Unit Type and Rh: 7300
Unit Type and Rh: 7300

## 2020-08-30 LAB — TYPE AND SCREEN
ABO/RH(D): B POS
Antibody Screen: NEGATIVE
Unit division: 0
Unit division: 0
Unit division: 0
Unit division: 0

## 2020-09-02 ENCOUNTER — Ambulatory Visit: Payer: BC Managed Care – PPO | Admitting: Family Medicine

## 2020-09-09 ENCOUNTER — Encounter: Payer: Self-pay | Admitting: Family Medicine

## 2020-09-09 ENCOUNTER — Ambulatory Visit (INDEPENDENT_AMBULATORY_CARE_PROVIDER_SITE_OTHER): Payer: BC Managed Care – PPO | Admitting: Family Medicine

## 2020-09-09 ENCOUNTER — Other Ambulatory Visit: Payer: Self-pay

## 2020-09-09 VITALS — BP 115/79 | HR 71 | Ht 67.0 in | Wt 244.0 lb

## 2020-09-09 DIAGNOSIS — Z4889 Encounter for other specified surgical aftercare: Secondary | ICD-10-CM

## 2020-09-09 NOTE — Progress Notes (Signed)
   Subjective:    Patient ID: Stefanie Hale, female    DOB: July 05, 1981, 39 y.o.   MRN: [id number]  HPI Patient is 2 week post op from LTCS due to transverse lie. Skin closed with staples. Doing fairly well   Review of Systems     Objective:   Physical Exam Vitals reviewed.  Constitutional:      Appearance: Normal appearance.  Cardiovascular:     Rate and Rhythm: Normal rate.  Abdominal:     Comments: Staples removed. Incision well healed. No erythema or drainage.  Skin:    Capillary Refill: Capillary refill takes less than 2 seconds.  Neurological:     Mental Status: She is alert.  Psychiatric:        Mood and Affect: Mood normal.        Behavior: Behavior normal.        Thought Content: Thought content normal.        Judgment: Judgment normal.        Assessment & Plan:  1. Encounter for post surgical wound check Staples removed. F/u in 2 weeks. Sooner if problems with incision develop

## 2020-09-09 NOTE — Progress Notes (Signed)
Patient presents for one week incision check. Armandina Stammer RN

## 2020-09-17 ENCOUNTER — Ambulatory Visit: Payer: BC Managed Care – PPO | Admitting: Family Medicine

## 2020-09-18 ENCOUNTER — Encounter: Payer: Self-pay | Admitting: Family Medicine

## 2020-09-18 ENCOUNTER — Ambulatory Visit (INDEPENDENT_AMBULATORY_CARE_PROVIDER_SITE_OTHER): Payer: BC Managed Care – PPO | Admitting: Family Medicine

## 2020-09-18 ENCOUNTER — Other Ambulatory Visit: Payer: Self-pay

## 2020-09-18 VITALS — BP 130/80 | HR 78 | Temp 98.7°F | Wt 237.0 lb

## 2020-09-18 DIAGNOSIS — Z4889 Encounter for other specified surgical aftercare: Secondary | ICD-10-CM

## 2020-09-18 NOTE — Progress Notes (Signed)
   Subjective:    Patient ID: Stefanie Hale, female    DOB: 1981/02/22, 40 y.o.   MRN: [id number]  HPI Seen for wound concerns - is concerned that her c/s wound is opening up in the middle by an inch or so. Had a small amount of bleeding.   Review of Systems     Objective:   Physical Exam Vitals reviewed.  Constitutional:      Appearance: Normal appearance.  Abdominal:     Comments: Incision intact. Epidermal layer visible in three areas in the middle, each approximately 2cm in length.   Neurological:     Mental Status: She is alert.       Assessment & Plan:  1. Encounter for post surgical wound check Silver nitrate applied. Patient reassured. F/u next week.

## 2020-09-24 ENCOUNTER — Ambulatory Visit: Payer: BC Managed Care – PPO | Admitting: Family Medicine

## 2020-10-08 ENCOUNTER — Ambulatory Visit: Payer: BC Managed Care – PPO | Admitting: Family Medicine

## 2020-10-08 NOTE — Progress Notes (Deleted)
    Post Partum Visit Note  Stefanie Hale is a 40 y.o. G47P3003 female who presents for a postpartum visit. She is 6 weeks postpartum following a repeat cesarean section.  I have fully reviewed the prenatal and intrapartum course. The delivery was at 62 gestational weeks.  Anesthesia: spinal. Postpartum course has been uneventful. Baby is doing well. Baby is feeding by {breast/bottle:69}. Bleeding {vag bleed:12292}. Bowel function is {normal:32111}. Bladder function is {normal:32111}. Patient is not sexually active. Contraception method is tubal ligation. Postpartum depression screening: {gen negative/positive:315881}.   The pregnancy intention screening data noted above was reviewed. Potential methods of contraception were discussed. The patient elected to proceed with Female Sterilization.     Review of Systems {ros; complete:30496}    Objective:  LMP 11/27/2019 (Exact Date)    General:  {gen appearance:16600}   Breasts:  {breast exam:1202::"inspection negative, no nipple discharge or bleeding, no masses or nodularity palpable"}  Lungs: {lung exam:16931}  Heart:  {heart exam:5510}  Abdomen: {abdomen exam:16834}   Vulva:  {labia exam:12198}  Vagina: {vagina exam:12200}  Cervix:  {cervix exam:14595}  Corpus: {uterus exam:12215}  Adnexa:  {adnexa exam:12223}  Rectal Exam: {rectal/vaginal exam:12274}        Assessment:    *** postpartum exam. Pap smear {done:10129} at today's visit.   Plan:   Essential components of care per ACOG recommendations:  1.  Mood and well being: Patient with {gen negative/positive:315881} depression screening today. Reviewed local resources for support.  - Patient {Action; does/does not:19097} use tobacco. ***If using tobacco we discussed reduction and for recently cessation risk of relapse - hx of drug use? {yes/no:20286}  *** If yes, discussed support systems  2. Infant care and feeding:  -Patient currently breastmilk feeding? {yes/no:20286}  ***If breastmilk feeding discussed return to work and pumping. If needed, patient was provided letter for work to allow for every 2-3 hr pumping breaks, and to be granted a private location to express breastmilk and refrigerated area to store breastmilk. Reviewed importance of draining breast regularly to support lactation. -Social determinants of health (SDOH) reviewed in EPIC. No concerns***The following needs were identified***  3. Sexuality, contraception and birth spacing - Patient {DOES_DOES WIO:03559} want a pregnancy in the next year.  Desired family size is {NUMBER 1-10:22536} children.  - Reviewed forms of contraception in tiered fashion. Patient desired {PLAN CONTRACEPTION:313102} today.   - Discussed birth spacing of 18 months  4. Sleep and fatigue -Encouraged family/partner/community support of 4 hrs of uninterrupted sleep to help with mood and fatigue  5. Physical Recovery  - Discussed patients delivery*** and complications - Patient had a *** degree laceration, perineal healing reviewed. Patient expressed understanding - Patient has urinary incontinence? {yes/no:20286}*** Patient was referred to pelvic floor PT  - Patient {ACTION; IS/IS RCB:63845364} safe to resume physical and sexual activity  6.  Health Maintenance - Last pap smear done *** and was {Normal/abnormal wildcard:19619} with negative HPV. ***Mammogram  7. ***Chronic Disease - PCP follow up  [name], [place] for [place]

## 2020-10-15 ENCOUNTER — Ambulatory Visit (INDEPENDENT_AMBULATORY_CARE_PROVIDER_SITE_OTHER): Payer: BC Managed Care – PPO | Admitting: Family Medicine

## 2020-10-15 ENCOUNTER — Other Ambulatory Visit: Payer: Self-pay

## 2020-10-15 ENCOUNTER — Encounter: Payer: Self-pay | Admitting: Family Medicine

## 2020-10-15 DIAGNOSIS — Z9851 Tubal ligation status: Secondary | ICD-10-CM

## 2020-10-15 DIAGNOSIS — Z98891 History of uterine scar from previous surgery: Secondary | ICD-10-CM

## 2020-10-15 NOTE — Progress Notes (Signed)
Post Partum Visit Note  Stefanie Hale is a 40 y.o. G44P3003 female who presents for a postpartum visit. She is 7 weeks postpartum following a primary cesarean section.  I have fully reviewed the prenatal and intrapartum course. The delivery was at 75 gestational weeks.  Anesthesia: spinal. Postpartum course has been normal. Baby is doing well. Baby is feeding by both breast and bottle - Enfamil Neuropro . Bleeding thin lochia. Bowel function is normal. Bladder function is normal. Patient is not sexually active. Contraception method is tubal ligation. Postpartum depression screening: negative.     Edinburgh Postnatal Depression Scale - 10/15/20 1529      Edinburgh Postnatal Depression Scale:  In the Past 7 Days   I have been able to laugh and see the funny side of things. 0    I have looked forward with enjoyment to things. 0    I have blamed myself unnecessarily when things went wrong. 0    I have been anxious or worried for no good reason. 0    I have felt scared or panicky for no good reason. 0    Things have been getting on top of me. 0    I have been so unhappy that I have had difficulty sleeping. 0    I have felt sad or miserable. 0    I have been so unhappy that I have been crying. 1    The thought of harming myself has occurred to me. 0    Edinburgh Postnatal Depression Scale Total 1            The following portions of the patient's history were reviewed and updated as appropriate: allergies, current medications, past family history, past medical history, past social history, past surgical history and problem list.  Review of Systems Pertinent items are noted in HPI.    Objective:  BP 130/81   Pulse 73   Ht 5\' 7"  (1.702 m)   Wt 242 lb (109.8 kg)   LMP 11/27/2019 (Exact Date)   Breastfeeding Yes   BMI 37.90 kg/m    General:  alert, cooperative and no distress  Lungs: clear to auscultation bilaterally  Heart:  regular rate and rhythm, S1, S2 normal, no  murmur, click, rub or gallop  Abdomen: soft, non-tender; bowel sounds normal; no masses,  no organomegaly. Incision clean, dry, intact. No erythema or drainage. Appears well healed.        Assessment:    Normal postpartum exam. Pap smear not done at today's visit.   Plan:   Essential components of care per ACOG recommendations:  1.  Mood and well being: Patient with negative depression screening today. Reviewed local resources for support.  - Patient does not use tobacco.  - hx of drug use? No    2. Infant care and feeding:  -Patient currently breastmilk feeding? Yes If breastmilk feeding discussed return to work and pumping. If needed, patient was provided letter for work to allow for every 2-3 hr pumping breaks, and to be granted a private location to express breastmilk and refrigerated area to store breastmilk. Reviewed importance of draining breast regularly to support lactation. -Social determinants of health (SDOH) reviewed in EPIC. No concerns  3. Sexuality, contraception and birth spacing - Patient does not want a pregnancy in the next year.  - Discussed birth spacing of 18 months  4. Sleep and fatigue -Encouraged family/partner/community support of 4 hrs of uninterrupted sleep to help with mood and fatigue  5. Physical Recovery  - Discussed patients delivery and complications - Patient has urinary incontinence? No - Patient is safe to resume physical and sexual activity  6.  Health Maintenance - Last pap smear done 01/2020 and was normal with negative HPV.  [place] for [employer], [place]

## 2020-12-03 ENCOUNTER — Ambulatory Visit: Payer: BC Managed Care – PPO | Admitting: Family Medicine

## 2020-12-16 ENCOUNTER — Ambulatory Visit: Payer: BC Managed Care – PPO | Admitting: Family Medicine

## 2021-01-06 ENCOUNTER — Other Ambulatory Visit: Payer: Self-pay

## 2021-01-06 ENCOUNTER — Encounter: Payer: Self-pay | Admitting: Family Medicine

## 2021-01-06 ENCOUNTER — Ambulatory Visit (INDEPENDENT_AMBULATORY_CARE_PROVIDER_SITE_OTHER): Payer: BC Managed Care – PPO | Admitting: Family Medicine

## 2021-01-06 VITALS — BP 132/85 | HR 96 | Ht 67.0 in | Wt 255.0 lb

## 2021-01-06 DIAGNOSIS — R102 Pelvic and perineal pain: Secondary | ICD-10-CM

## 2021-01-06 DIAGNOSIS — N939 Abnormal uterine and vaginal bleeding, unspecified: Secondary | ICD-10-CM | POA: Diagnosis not present

## 2021-01-06 MED ORDER — MEGESTROL ACETATE 40 MG PO TABS: 40.0000 mg | ORAL_TABLET | Freq: Every day | ORAL | 5 refills | Status: DC

## 2021-01-06 NOTE — Progress Notes (Signed)
   Subjective:    Patient ID: Stefanie Hale, female    DOB: 07/11/1981, 40 y.o.   MRN: [id number]  HPI  Patient seen for menorrhagia since delivery. Has had 3-4 deliveries. Menses heavy like prior to ablation. Bleeding through 2 pads at a time. Also having some pain in pelvis. Lasts about a minute.   Has BTL.  Review of Systems     Objective:   Physical Exam Vitals reviewed.  Constitutional:      Appearance: Normal appearance.  Cardiovascular:     Pulses: Normal pulses.  Pulmonary:     Effort: Pulmonary effort is normal.  Abdominal:     General: Abdomen is flat.     Palpations: Abdomen is soft.  Skin:    Capillary Refill: Capillary refill takes less than 2 seconds.  Neurological:     Mental Status: She is alert.        Assessment & Plan:  1. Abnormal uterine bleeding (AUB) Discussed options - would like to try medical first. Will try megace 40mg  daily to see if we can suppress her menses.  2. Pelvic pain Check [place] - US PELVIC COMPLETE WITH TRANSVAGINAL; Future

## 2021-01-06 NOTE — Progress Notes (Signed)
Patient states her bleeding is about the same before her Novasure ablation. Patient states she is having some pelvic pain as well. [name] RN

## 2021-01-11 ENCOUNTER — Ambulatory Visit (HOSPITAL_BASED_OUTPATIENT_CLINIC_OR_DEPARTMENT_OTHER)
Admission: RE | Admit: 2021-01-11 | Discharge: 2021-01-11 | Disposition: A | Discharge status: Home / Self Care | Payer: BC Managed Care – PPO | Source: Ambulatory Visit | Attending: Family Medicine | Admitting: Family Medicine

## 2021-01-11 ENCOUNTER — Other Ambulatory Visit: Payer: Self-pay

## 2021-01-11 DIAGNOSIS — R102 Pelvic and perineal pain: Secondary | ICD-10-CM | POA: Diagnosis not present

## 2021-03-11 ENCOUNTER — Ambulatory Visit: Payer: BC Managed Care – PPO | Admitting: Family Medicine

## 2021-03-18 ENCOUNTER — Ambulatory Visit: Payer: BC Managed Care – PPO | Admitting: Family Medicine

## 2021-04-01 ENCOUNTER — Ambulatory Visit: Payer: BC Managed Care – PPO | Admitting: Family Medicine

## 2021-04-22 ENCOUNTER — Ambulatory Visit: Payer: BC Managed Care – PPO | Admitting: Family Medicine

## 2021-05-13 MED ORDER — FLUCONAZOLE 150 MG PO TABS: 150.0000 mg | ORAL_TABLET | ORAL | 3 refills | Status: AC

## 2021-06-09 ENCOUNTER — Ambulatory Visit: Payer: BC Managed Care – PPO | Admitting: Family Medicine

## 2021-09-20 ENCOUNTER — Encounter: Payer: Self-pay | Admitting: Family Medicine

## 2021-09-20 ENCOUNTER — Emergency Department (HOSPITAL_BASED_OUTPATIENT_CLINIC_OR_DEPARTMENT_OTHER)
Admission: EM | Admit: 2021-09-20 | Discharge: 2021-09-20 | Disposition: A | Discharge status: Home / Self Care | Payer: BC Managed Care – PPO | Attending: Emergency Medicine | Admitting: Emergency Medicine

## 2021-09-20 ENCOUNTER — Other Ambulatory Visit: Payer: Self-pay

## 2021-09-20 ENCOUNTER — Encounter (HOSPITAL_BASED_OUTPATIENT_CLINIC_OR_DEPARTMENT_OTHER): Payer: Self-pay | Admitting: *Deleted

## 2021-09-20 DIAGNOSIS — R519 Headache, unspecified: Secondary | ICD-10-CM | POA: Diagnosis not present

## 2021-09-20 DIAGNOSIS — M25512 Pain in left shoulder: Secondary | ICD-10-CM | POA: Insufficient documentation

## 2021-09-20 DIAGNOSIS — Z5321 Procedure and treatment not carried out due to patient leaving prior to being seen by health care provider: Secondary | ICD-10-CM | POA: Diagnosis not present

## 2021-09-20 NOTE — Telephone Encounter (Signed)
Do you think these sxs warrant an ED visit?

## 2021-09-20 NOTE — Telephone Encounter (Signed)
Pt called back regarding Dr. Lillie Fragmin missed call. Advised pt Dr. Lorelei Pont sent her a message on mychart, and relayed message. Pt stated she is at work but will try to go to ed downstairs at her lunch break. Please advise.

## 2021-09-20 NOTE — ED Triage Notes (Signed)
Severe headache yesterday having seeing blinking lights. No hx of migraines. No headache today. She has an achy feeling in her left shoulder today. Pt is ambulatory.

## 2021-09-21 ENCOUNTER — Encounter (HOSPITAL_BASED_OUTPATIENT_CLINIC_OR_DEPARTMENT_OTHER): Payer: Self-pay | Admitting: Emergency Medicine

## 2021-09-21 ENCOUNTER — Emergency Department (HOSPITAL_BASED_OUTPATIENT_CLINIC_OR_DEPARTMENT_OTHER)
Admission: EM | Admit: 2021-09-21 | Discharge: 2021-09-21 | Disposition: A | Discharge status: Home / Self Care | Payer: BC Managed Care – PPO | Attending: Emergency Medicine | Admitting: Emergency Medicine

## 2021-09-21 ENCOUNTER — Other Ambulatory Visit: Payer: Self-pay

## 2021-09-21 ENCOUNTER — Encounter: Payer: Self-pay | Admitting: Family Medicine

## 2021-09-21 DIAGNOSIS — G43109 Migraine with aura, not intractable, without status migrainosus: Secondary | ICD-10-CM

## 2021-09-21 DIAGNOSIS — G43809 Other migraine, not intractable, without status migrainosus: Secondary | ICD-10-CM | POA: Insufficient documentation

## 2021-09-21 DIAGNOSIS — R519 Headache, unspecified: Secondary | ICD-10-CM | POA: Diagnosis present

## 2021-09-21 NOTE — Discharge Instructions (Signed)
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  These are benign and usually self resolving.  Take Tylenol and ibuprofen as needed for headaches.  Follow-up with primary care doctor as discussed if continued having the symptoms.  If you have complete vision loss recommend returning to the emergency department.

## 2021-09-21 NOTE — ED Triage Notes (Signed)
Left after triage yesterday , here for same issues , more left arm pain , headache and eye irritation .

## 2021-09-21 NOTE — ED Provider Notes (Signed)
[facility] EMERGENCY DEPARTMENT Provider Note   CSN: [id number] Arrival date & time: 09/21/21  0849     History  Chief Complaint  Patient presents with   Headache    Stefanie Hale is a 41 y.o. female.  Patient was here yesterday and left without being seen.  She describes having some blurred vision and then later on a headache.  Blurred vision lasted about 20 minutes and then resolved.  Headache started soon thereafter worsened and has now resolved.  She has no weakness or numbness.  She is asymptomatic now.  Came back for evaluation as she was unable to be seen yesterday.  No history of migraines.  No major medical history.  The history is provided by the patient.  Headache Severity currently:  0/10 Duration:  1 day Progression:  Resolved Similar to prior headaches: no   Relieved by:  Nothing Associated symptoms: no abdominal pain, no blurred vision, no congestion, no cough and no diarrhea       Home Medications Prior to Admission medications   Medication Sig Start Date End Date Taking? Authorizing Provider  acetaminophen (TYLENOL) 500 MG tablet Take 2 tablets (1,000 mg total) by mouth every 8 (eight) hours. Patient not taking: Reported on 09/18/2020 08/29/20   Gaylan Gerold R, CNM  coconut oil OIL Apply 1 application topically as needed (nipple pain). Patient not taking: Reported on 09/18/2020 08/29/20   Gabriel Carina, CNM  ferrous sulfate 325 (65 FE) MG tablet Take 1 tablet (325 mg total) by mouth every other day. 08/29/20   Gabriel Carina, CNM  ibuprofen (ADVIL) 800 MG tablet Take 1 tablet (800 mg total) by mouth every 8 (eight) hours as needed. 08/29/20   Gabriel Carina, CNM  megestrol (MEGACE) 40 MG tablet Take 1 tablet (40 mg total) by mouth daily. 01/06/21   Truett Mainland, DO  oxyCODONE-acetaminophen (PERCOCET/ROXICET) 5-325 MG tablet Take by mouth every 4 (four) hours as needed for severe pain. Patient not taking: Reported on 09/18/2020     [provider]  Prenatal Vit-Fe Fumarate-FA (PRENATAL VITAMINS PO) Take by mouth.    [provider]      Allergies    Influenza vaccines and Penicillins    Review of Systems   Review of Systems  HENT:  Negative for congestion.   Eyes:  Negative for blurred vision.  Respiratory:  Negative for cough.   Gastrointestinal:  Negative for abdominal pain and diarrhea.  Neurological:  Positive for headaches.   Physical Exam Updated Vital Signs Temp 98.1 F (36.7 C) (Oral)    Ht 5\' 7"  (1.702 m)    Wt 114.8 kg    LMP 08/26/2021 (Approximate)    BMI 39.63 kg/m  Physical Exam Vitals and nursing note reviewed.  Constitutional:      General: She is not in acute distress.    Appearance: She is well-developed. She is not ill-appearing.  HENT:     Head: Normocephalic and atraumatic.     Mouth/Throat:     Mouth: Mucous membranes are moist.  Eyes:     General: No visual field deficit.    Extraocular Movements: Extraocular movements intact.     Right eye: Normal extraocular motion and no nystagmus.     Left eye: Normal extraocular motion and no nystagmus.     Conjunctiva/sclera: Conjunctivae normal.     Pupils: Pupils are equal, round, and reactive to light.     Right eye: Pupil is reactive.  Left eye: Pupil is reactive.  Cardiovascular:     Rate and Rhythm: Normal rate and regular rhythm.     Heart sounds: No murmur heard. Pulmonary:     Effort: Pulmonary effort is normal. No respiratory distress.     Breath sounds: Normal breath sounds.  Abdominal:     Palpations: Abdomen is soft.     Tenderness: There is no abdominal tenderness.  Musculoskeletal:        General: No swelling.     Cervical back: Normal range of motion and neck supple.  Skin:    General: Skin is warm and dry.     Capillary Refill: Capillary refill takes less than 2 seconds.  Neurological:     Mental Status: She is alert and oriented to person, place, and time.     Cranial Nerves: No cranial  nerve deficit, dysarthria or facial asymmetry.     Sensory: No sensory deficit.     Motor: No weakness.     Coordination: Coordination normal.     Gait: Gait normal.     Comments: 5+ out of 5 strength throughout, normal sensation, no drift, normal finger-nose-finger, normal speech  Psychiatric:        Mood and Affect: Mood normal.    ED Results / Procedures / Treatments   Labs (all labs ordered are listed, but only abnormal results are displayed) Labs Reviewed - No data to display  EKG None  Radiology No results found.  Procedures Procedures    Medications Ordered in ED Medications - No data to display  ED Course/ Medical Decision Making/ A&P                           Medical Decision Making  [name] is here after having a headache and some blurred vision yesterday.  Normal vitals.  No fever.  Patient states that she had some blurry vision with some brightness in the eye for about 10 to 15 minutes and then went away and then later on developed a headache.  There was no complete vision loss.  There is no weakness or numbness.  Neurologically she is intact.  She has no eye pain or abnormal eye exam.  She is got good visual acuity and visual fields.  She is asymptomatic now.  She came to the ED yesterday and was unable to be seen so she came back today.  Overall my differential includes ocular migraine versus basic migraine and have a low suspicion for multiple sclerosis or stroke.  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  These are fairly benign.  No concern for acute neurologic process or acute eye issue at this time including glaucoma.  She is given reassurance and discharged in the ED in good condition.  Recommend that she has complete vision loss with no associated headache or other concerning features she should come back.  Discharged in good condition.  This chart was dictated using voice recognition software.   Despite best efforts to proofread,  errors can occur which can change the documentation meaning.         Final Clinical Impression(s) / ED Diagnoses Final diagnoses:  Ocular migraine    Rx / DC Orders ED Discharge Orders     None         Lennice Sites, DO 09/21/21 3419

## 2022-10-10 ENCOUNTER — Telehealth: Payer: Self-pay | Admitting: Family Medicine

## 2022-10-10 NOTE — Telephone Encounter (Signed)
Patient called to schedule her annual physical but last visit was in 2021. Okay to re-establish care?

## 2022-10-11 NOTE — Telephone Encounter (Signed)
LVM to schedule appt for patient to reestablish care.

## 2022-10-14 ENCOUNTER — Encounter: Payer: Self-pay | Admitting: Family Medicine

## 2023-02-20 ENCOUNTER — Other Ambulatory Visit: Payer: Self-pay

## 2023-02-20 ENCOUNTER — Encounter: Payer: Self-pay | Admitting: Family Medicine

## 2023-02-20 MED ORDER — FLUCONAZOLE 150 MG PO TABS
150.0000 mg | ORAL_TABLET | Freq: Once | ORAL | 0 refills | Status: AC
Start: 2023-02-20 — End: 2023-02-20

## 2023-02-22 ENCOUNTER — Encounter: Payer: Self-pay | Admitting: Family Medicine

## 2023-02-23 ENCOUNTER — Encounter: Payer: Self-pay | Admitting: Family Medicine

## 2023-02-23 ENCOUNTER — Ambulatory Visit: Payer: Managed Care, Other (non HMO) | Admitting: Family Medicine

## 2023-02-23 ENCOUNTER — Other Ambulatory Visit (HOSPITAL_COMMUNITY)
Admission: RE | Admit: 2023-02-23 | Discharge: 2023-02-23 | Disposition: A | Discharge status: Home / Self Care | Payer: Managed Care, Other (non HMO) | Source: Ambulatory Visit | Attending: Family Medicine | Admitting: Family Medicine

## 2023-02-23 VITALS — BP 118/76 | HR 75 | Ht 67.0 in | Wt 258.0 lb

## 2023-02-23 DIAGNOSIS — Z01419 Encounter for gynecological examination (general) (routine) without abnormal findings: Secondary | ICD-10-CM

## 2023-02-23 DIAGNOSIS — N939 Abnormal uterine and vaginal bleeding, unspecified: Secondary | ICD-10-CM

## 2023-02-23 LAB — POCT URINALYSIS DIPSTICK
Bilirubin, UA: NEGATIVE
Glucose, UA: NEGATIVE
Ketones, UA: NEGATIVE
Nitrite, UA: NEGATIVE
Protein, UA: NEGATIVE
Spec Grav, UA: 1.015 (ref 1.010–1.025)
Urobilinogen, UA: 0.2 E.U./dL
pH, UA: 6.5 (ref 5.0–8.0)

## 2023-02-23 MED ORDER — NORETHINDRONE ACETATE 5 MG PO TABS: 5.0000 mg | ORAL_TABLET | Freq: Every day | ORAL | 2 refills | Status: AC

## 2023-02-23 NOTE — Progress Notes (Signed)
ANNUAL EXAM Patient name: Stefanie Hale MRN [id number]  Date of birth: November 01, 1980 Chief Complaint:   Annual Exam  History of Present Illness:   Stefanie Hale is a 42 y.o.  [phone]  female  being seen today for a routine annual exam.  Current complaints: HMB during the first few days of her period. She has a history of an ablation in 2021, then a pregnancy  Patient's last menstrual period was 01/19/2023 (exact date).    Last pap 2021. Results were:  normal . H/O abnormal pap: no Last mammogram: needs to schedule     02/23/2023    1:14 PM 09/07/2016    1:15 PM 03/30/2016    1:28 PM  Depression screen PHQ 2/9  Decreased Interest 0 0 0  Down, Depressed, Hopeless 0 1 0  PHQ - 2 Score 0 1 0  Altered sleeping 0    Tired, decreased energy 0    Change in appetite 0    Feeling bad or failure about yourself  0    Trouble concentrating 0    Moving slowly or fidgety/restless 0    Suicidal thoughts 0    PHQ-9 Score 0          02/23/2023    1:15 PM  GAD 7 : Generalized Anxiety Score  Nervous, Anxious, on Edge 0  Control/stop worrying 0  Worry too much - different things 0  Trouble relaxing 0  Restless 0  Easily annoyed or irritable 0  Afraid - awful might happen 0  Total GAD 7 Score 0     Review of Systems:   Pertinent items are noted in HPI Denies any headaches, blurred vision, fatigue, shortness of breath, chest pain, abdominal pain, abnormal vaginal discharge/itching/odor/irritation, problems with periods, bowel movements, urination, or intercourse unless otherwise stated above. Pertinent History Reviewed:  Reviewed past medical,surgical, social and family history.  Reviewed problem list, medications and allergies. Physical Assessment:   Vitals:   02/23/23 1303  BP: 118/76  Pulse: 75  Weight: 258 lb (117 kg)  Height: 5\' 7"  (1.702 m)  Body mass index is 40.41 kg/m.        Physical Examination:   General appearance - well appearing, and in no  distress  Mental status - alert, oriented to person, place, and time  Psych:  She has a normal mood and affect  Skin - warm and dry, normal color, no suspicious lesions noted  Chest - effort normal, all lung fields clear to auscultation bilaterally  Heart - normal rate and regular rhythm  Neck:  midline trachea, no thyromegaly or nodules  Breasts - breasts appear normal, no suspicious masses, no skin or nipple changes or axillary nodes  Abdomen - soft, nontender, nondistended, no masses or organomegaly  Pelvic - VULVA: normal appearing vulva with no masses, tenderness or lesions  VAGINA: normal appearing vagina with normal color and discharge, no lesions  CERVIX: normal appearing cervix without discharge or lesions, no CMT  Thin prep pap is done with HR HPV cotesting  UTERUS: uterus is felt to be normal size, shape, consistency and nontender   ADNEXA: No adnexal masses or tenderness noted.  Extremities:  No swelling or varicosities noted  Chaperone present for exam  Assessment & Plan:  1. Well woman exam with routine gynecological exam   2. Irregular menses Start aygestin for 3 weeks, then off for a week. F/u in 3 weeks   Labs/procedures today:    No orders of the defined  types were placed in this encounter.   Meds: No orders of the defined types were placed in this encounter.   Follow-up: No follow-ups on file.  Levie Heritage, DO 02/23/2023 1:43 PM

## 2023-02-27 LAB — CYTOLOGY - PAP
Comment: NEGATIVE
Diagnosis: NEGATIVE
High risk HPV: NEGATIVE

## 2023-02-28 ENCOUNTER — Encounter: Payer: Self-pay | Admitting: Family Medicine

## 2023-03-03 MED ORDER — FLUCONAZOLE 150 MG PO TABS: 150.0000 mg | ORAL_TABLET | Freq: Once | ORAL | 3 refills | Status: AC

## 2023-03-07 ENCOUNTER — Encounter (HOSPITAL_BASED_OUTPATIENT_CLINIC_OR_DEPARTMENT_OTHER): Payer: Self-pay

## 2023-03-07 ENCOUNTER — Inpatient Hospital Stay (HOSPITAL_BASED_OUTPATIENT_CLINIC_OR_DEPARTMENT_OTHER): Admission: RE | Admit: 2023-03-07 | Payer: BC Managed Care – PPO | Source: Ambulatory Visit

## 2023-03-17 ENCOUNTER — Telehealth (HOSPITAL_BASED_OUTPATIENT_CLINIC_OR_DEPARTMENT_OTHER): Payer: Self-pay

## 2023-07-26 ENCOUNTER — Other Ambulatory Visit: Payer: Self-pay

## 2023-07-26 ENCOUNTER — Ambulatory Visit: Payer: Managed Care, Other (non HMO) | Admitting: Family Medicine

## 2023-07-26 ENCOUNTER — Telehealth: Payer: Self-pay

## 2023-07-26 ENCOUNTER — Emergency Department (HOSPITAL_BASED_OUTPATIENT_CLINIC_OR_DEPARTMENT_OTHER)
Admission: EM | Admit: 2023-07-26 | Discharge: 2023-07-26 | Disposition: A | Discharge status: Home / Self Care | Payer: Managed Care, Other (non HMO) | Attending: Emergency Medicine | Admitting: Emergency Medicine

## 2023-07-26 VITALS — BP 114/62 | HR 76 | Temp 98.6°F | Resp 18 | Ht 67.0 in | Wt 256.0 lb

## 2023-07-26 DIAGNOSIS — K920 Hematemesis: Secondary | ICD-10-CM

## 2023-07-26 DIAGNOSIS — R112 Nausea with vomiting, unspecified: Secondary | ICD-10-CM | POA: Insufficient documentation

## 2023-07-26 DIAGNOSIS — R519 Headache, unspecified: Secondary | ICD-10-CM | POA: Diagnosis not present

## 2023-07-26 DIAGNOSIS — J45909 Unspecified asthma, uncomplicated: Secondary | ICD-10-CM | POA: Insufficient documentation

## 2023-07-26 LAB — URINALYSIS, ROUTINE W REFLEX MICROSCOPIC
Bilirubin Urine: NEGATIVE
Glucose, UA: NEGATIVE mg/dL
Ketones, ur: NEGATIVE mg/dL
Leukocytes,Ua: NEGATIVE
Nitrite: NEGATIVE
Protein, ur: NEGATIVE mg/dL
Specific Gravity, Urine: <=1.005 (ref 1.005–1.030)
pH: 5 (ref 5.0–8.0)

## 2023-07-26 LAB — CBC WITH DIFFERENTIAL/PLATELET
Abs Immature Granulocytes: 0.01 10*3/uL (ref 0.00–0.07)
Basophils Absolute: 0 10*3/uL (ref 0.0–0.1)
Basophils Relative: 0 %
Eosinophils Absolute: 0 10*3/uL (ref 0.0–0.5)
Eosinophils Relative: 1 %
HCT: 36.6 % (ref 36.0–46.0)
Hemoglobin: 12 g/dL (ref 12.0–15.0)
Immature Granulocytes: 0 %
Lymphocytes Relative: 33 %
Lymphs Abs: 1.2 10*3/uL (ref 0.7–4.0)
MCH: 30.9 pg (ref 26.0–34.0)
MCHC: 32.8 g/dL (ref 30.0–36.0)
MCV: 94.3 fL (ref 80.0–100.0)
Monocytes Absolute: 0.3 10*3/uL (ref 0.1–1.0)
Monocytes Relative: 8 %
Neutro Abs: 2.1 10*3/uL (ref 1.7–7.7)
Neutrophils Relative %: 58 %
Platelets: 275 10*3/uL (ref 150–400)
RBC: 3.88 MIL/uL (ref 3.87–5.11)
RDW: 12.6 % (ref 11.5–15.5)
WBC: 3.7 10*3/uL — ABNORMAL LOW (ref 4.0–10.5)
nRBC: 0 % (ref 0.0–0.2)

## 2023-07-26 LAB — URINALYSIS, MICROSCOPIC (REFLEX)

## 2023-07-26 LAB — COMPREHENSIVE METABOLIC PANEL
ALT: 11 U/L (ref 0–44)
AST: 16 U/L (ref 15–41)
Albumin: 3.8 g/dL (ref 3.5–5.0)
Alkaline Phosphatase: 42 U/L (ref 38–126)
Anion gap: 9 (ref 5–15)
BUN: 13 mg/dL (ref 6–20)
CO2: 22 mmol/L (ref 22–32)
Calcium: 8.7 mg/dL — ABNORMAL LOW (ref 8.9–10.3)
Chloride: 102 mmol/L (ref 98–111)
Creatinine, Ser: 0.88 mg/dL (ref 0.44–1.00)
GFR, Estimated: >60 mL/min (ref >60)
Glucose, Bld: 84 mg/dL (ref 70–99)
Potassium: 4 mmol/L (ref 3.5–5.1)
Sodium: 133 mmol/L — ABNORMAL LOW (ref 135–145)
Total Bilirubin: 1 mg/dL (ref <1.2)
Total Protein: 7.6 g/dL (ref 6.5–8.1)

## 2023-07-26 LAB — HCG, SERUM, QUALITATIVE: Preg, Serum: NEGATIVE

## 2023-07-26 LAB — POCT URINE PREGNANCY: Preg Test, Ur: NEGATIVE

## 2023-07-26 LAB — LIPASE, BLOOD: Lipase: 34 U/L (ref 11–51)

## 2023-07-26 MED ORDER — PROCHLORPERAZINE EDISYLATE 10 MG/2ML IJ SOLN
10.0000 mg | Freq: Once | INTRAMUSCULAR | Status: AC
  Administered 2023-07-26: 10 mg via INTRAVENOUS
  Filled 2023-07-26: qty 2

## 2023-07-26 MED ORDER — DIPHENHYDRAMINE HCL 50 MG/ML IJ SOLN
12.5000 mg | Freq: Once | INTRAMUSCULAR | Status: AC
  Administered 2023-07-26: 12.5 mg via INTRAVENOUS
  Filled 2023-07-26: qty 1

## 2023-07-26 MED ORDER — ONDANSETRON 4 MG PO TBDP: 4.0000 mg | ORAL_TABLET | Freq: Three times a day (TID) | ORAL | 0 refills | Status: AC | PRN

## 2023-07-26 NOTE — ED Notes (Signed)
Pt alert and oriented X 4 at the time of discharge. RR even and unlabored. No acute distress noted. Pt verbalized understanding of discharge instructions as discussed. Pt ambulatory to lobby at time of discharge.

## 2023-07-26 NOTE — Telephone Encounter (Signed)
FYI- appt today w/ Dr. Patsy Lager

## 2023-07-26 NOTE — ED Triage Notes (Signed)
Intermittent headache and emesis x 4 days , Hx migraine , no URI symptoms .  No vision changes or weakness to extremities ,

## 2023-07-26 NOTE — ED Notes (Signed)
Pt able to tolerate PO intake. Pt denies nausea at this time.

## 2023-07-26 NOTE — Telephone Encounter (Signed)
Initial Comment Caller states she has had a headache the past 3 days and vomiting. No blood in vomit. Has urinated in the past 8 hours. Translation No Disp. Time Lamount Cohen Time) Disposition Final User 07/26/2023 8:42:57 AM Attempt made - message left Jurovschi, RN, [place] 07/26/2023 9:06:31 AM Attempt made - no message left Jurovschi, RN, A[place]ine 07/26/2023 9:19:17 AM FINAL ATTEMPT MADE - message left Yes Jurovschi, RN, Ayrine Final Disposition 07/26/2023 9:19:17 AM FINAL ATTEMPT MADE - message left Yes Jurovschi, RN, [place]

## 2023-07-26 NOTE — Discharge Instructions (Signed)
It was a pleasure taking care of you today.  As discussed, your labs are reassuring.  Your physical exam was also reassuring.  I am sending you home with nausea medication.  Take as needed for nausea.  Drink extra fluids over the next few days.  Return to the ER for any worsening symptoms.

## 2023-07-26 NOTE — ED Provider Notes (Signed)
[place] [facility] Provider Note   CSN: [id number] Arrival date & time: 07/26/23  1442     History  Chief Complaint  Patient presents with   Migraine    Stefanie Hale is a 42 y.o. female with a past medical history significant for anemia and history of migraines who presents to the ED due to intermittent frontal headache associated with nausea and vomiting x 4 days.  Patient seen by PCP prior to arrival and sent to the ED for further evaluation.  Denies visual and speech changes.  No unilateral weakness.  No dizziness.  Patient states headache has somewhat improved.  Notes it feels similar to her previous migraines.  Admits to numerous episodes of nonbilious emesis for the past 4 days.  Developed some streaks of blood in her vomit earlier today.  Notes she has been vomiting 1-2 times daily for the past 4 days.  Admits to constant nausea.  No diarrhea.  Also endorses some left-sided abdominal pain.  No chest pain or shortness of breath.  History obtained from patient and past medical records. No interpreter used during encounter.       Home Medications Prior to Admission medications   Medication Sig Start Date End Date Taking? Authorizing Provider  ondansetron (ZOFRAN-ODT) 4 MG disintegrating tablet Take 1 tablet (4 mg total) by mouth every 8 (eight) hours as needed for nausea or vomiting. 07/26/23  Yes Grethel Zenk, Merla Riches, PA-C  norethindrone (AYGESTIN) 5 MG tablet Take 1 tablet (5 mg total) by mouth daily. Take for 3 weeks, then off for a week. 02/23/23   Levie Heritage, DO      Allergies    Influenza vaccines and Penicillins    Review of Systems   Review of Systems  Constitutional:  Negative for fever.  Respiratory:  Negative for shortness of breath.   Cardiovascular:  Negative for chest pain.  Gastrointestinal:  Positive for abdominal pain, nausea and vomiting. Negative for diarrhea.  Neurological:  Positive for headaches.     Physical Exam Updated Vital Signs BP 121/69 (BP Location: Left Arm)   Pulse 75   Temp 98.8 F (37.1 C)   Resp 18   Wt 114.3 kg   LMP 06/29/2023 (Exact Date)   SpO2 100%   BMI 39.47 kg/m  Physical Exam Vitals and nursing note reviewed.  Constitutional:      General: She is not in acute distress.    Appearance: She is not ill-appearing.  HENT:     Head: Normocephalic.  Eyes:     Pupils: Pupils are equal, round, and reactive to light.  Cardiovascular:     Rate and Rhythm: Normal rate and regular rhythm.     Pulses: Normal pulses.     Heart sounds: Normal heart sounds. No murmur heard.    No friction rub. No gallop.  Pulmonary:     Effort: Pulmonary effort is normal.     Breath sounds: Normal breath sounds.  Abdominal:     General: Abdomen is flat. There is no distension.     Palpations: Abdomen is soft.     Tenderness: There is no abdominal tenderness. There is no guarding or rebound.  Musculoskeletal:        General: Normal range of motion.     Cervical back: Neck supple.  Skin:    General: Skin is warm and dry.  Neurological:     General: No focal deficit present.     Mental Status:  She is alert.     Comments: Speech is clear, able to follow commands CN III-XII intact Normal strength in upper and lower extremities bilaterally including dorsiflexion and plantar flexion, strong and equal grip strength Sensation grossly intact throughout Moves extremities without ataxia, coordination intact No pronator drift Ambulates without difficulty  Psychiatric:        Mood and Affect: Mood normal.        Behavior: Behavior normal.     ED Results / Procedures / Treatments   Labs (all labs ordered are listed, but only abnormal results are displayed) Labs Reviewed  CBC WITH DIFFERENTIAL/PLATELET - Abnormal; Notable for the following components:      Result Value   WBC 3.7 (*)    All other components within normal limits  COMPREHENSIVE METABOLIC PANEL - Abnormal;  Notable for the following components:   Sodium 133 (*)    Calcium 8.7 (*)    All other components within normal limits  URINALYSIS, ROUTINE W REFLEX MICROSCOPIC - Abnormal; Notable for the following components:   Hgb urine dipstick TRACE (*)    All other components within normal limits  URINALYSIS, MICROSCOPIC (REFLEX) - Abnormal; Notable for the following components:   Bacteria, UA RARE (*)    All other components within normal limits  LIPASE, BLOOD  HCG, SERUM, QUALITATIVE    EKG None  Radiology No results found.  Procedures Procedures    Medications Ordered in ED Medications  diphenhydrAMINE (BENADRYL) injection 12.5 mg (12.5 mg Intravenous Given 07/26/23 1532)  prochlorperazine (COMPAZINE) injection 10 mg (10 mg Intravenous Given 07/26/23 1531)    ED Course/ Medical Decision Making/ A&P Clinical Course as of 07/26/23 1716  Wed Jul 26, 2023  1707 WBC(!): 3.7 [CA]    Clinical Course User Index [CA] Mannie Stabile, PA-C                                 Medical Decision Making Amount and/or Complexity of Data Reviewed External Data Reviewed: notes.    Details: PCP note Labs: ordered. Decision-making details documented in ED Course.  Risk Prescription drug management.   This patient presents to the ED for concern of n/v and headache, this involves an extensive number of treatment options, and is a complaint that carries with it a high risk of complications and morbidity.  The differential diagnosis includes migraine, gastroenteritis, pancreatitis, intracranial bleed, etc  42 year old female presents to the ED due to intermittent headache and nausea and vomiting x 4 days.  Patient admits to 1-2 episodes of daily emesis for the past 4 days with constant nausea.  Admits to streaks of blood in emesis earlier today.  Sent by PCP for possible IVFs.  Upon arrival, vitals all within normal limits.  Patient in no acute distress.  Reassuring physical exam.  Normal  neurological exam without any neurological deficits.  Abdomen soft, nondistended, nontender.  Low suspicion for acute abdomen so will hold off on CT abdomen at this time.  Migraine cocktail given.  Routine labs ordered.  Will hold on IV fluids due to IV fluid shortage at this time pending labs.  If significant dehydration evident on labs will give IV fluids.  If nausea improves will start to give oral hydration. Low suspicion for CVA or emergent intracranial etiologies of headache, so will hold off on CT head.   CBC significant for leukopenia 3.7.  Normal hemoglobin.  CMP reassuring.  Normal renal  function.  Slight hyponatremia at 133.  Lipase normal.  Low suspicion for pancreatitis.  Pregnancy test negative.  UA negative for signs of infection.  5:11 PM reassessed patient at bedside.  Patient notes her headache has completely resolved.  No further episodes of emesis while here in the ED.  Patient able to tolerate p.o. at bedside.  Abdomen soft, nondistended, nontender.  Low suspicion for acute abdomen.  Patient stable for discharge.  Patient discharged with Zofran as needed for nausea. Strict ED precautions discussed with patient. Patient states understanding and agrees to plan. Patient discharged home in no acute distress and stable vitals  Co morbidities that complicate the patient evaluation  Asthma, anemia  Social Determinants of Health:  PCP on file  Test / Admission - Considered:  CT abdomen; however benign abdominal exam.  Low suspicion for acute abdomen.  CT head however, normal neurological exam.  Low suspicion for any emergent intracranial etiologies of headache.       Final Clinical Impression(s) / ED Diagnoses Final diagnoses:  Nausea and vomiting, unspecified vomiting type    Rx / DC Orders ED Discharge Orders          Ordered    ondansetron (ZOFRAN-ODT) 4 MG disintegrating tablet  Every 8 hours PRN        07/26/23 1714              Mannie Stabile,  PA-C 07/26/23 1725    Tegeler, Canary Brim, MD 07/26/23 1726

## 2023-07-26 NOTE — Progress Notes (Signed)
[facility] [address], [address] [place], [place] [address] [phone]  Date:  07/26/2023   Name:  Stefanie Hale   DOB:  01-Jun-1981   MRN:  [id number]  PCP:  Pearline Cables, MD    Chief Complaint: Emesis (X 4 days ago. She started to vomit blood. Headache, pressure in the abdomen. All food comes back up. )   History of Present Illness:  Stefanie Hale is a 42 y.o. very pleasant female patient who presents with the following:  Patient seen today with concern of migraine headache Last seen by myself in 2021 She does follow-up regular with her gynecologist, well woman exam over the summer  Today is Wednesday- on Saturday she started vomiting- has kept this up for the last 4 days When she vomited earlier today there was a small amount of streaky blood in her vomitus  No diarrhea She has not been able to keep food down the last few days She is able to drink a little water and ginger ale and keep it down She notes a feeling of pressure in her central abdomen  She has not tended to get headaches as much recently as she did in the past - notes her current HA "comes and goes"  No fever noted No one else sick  She is not aware of any suspicious foods   She tried a nausea med- she thinks it was zofran- she had on hand but it did not help   She is s/p BTL- should not be pregnant   Patient Active Problem List   Diagnosis Date Noted   History of tubal ligation 08/29/2020   History of cesarean section, classical 08/26/2020   History of surgery involving uterine cervix, antepartum 02/24/2020   Fibroids 01/02/2018   Sinusitis 07/25/2013   Irregular menses 07/01/2013   Back pain 01/01/2012   Rash 12/28/2011   SOMNOLENCE 11/24/2010   EPISTAXIS, RECURRENT 11/24/2010    Past Medical History:  Diagnosis Date   Anemia    Childhood asthma    no inhaler,  no problems as adult   Complication of anesthesia     SVD (spontaneous vaginal delivery)    x 2    Past Surgical History:  Procedure Laterality Date   CESAREAN SECTION WITH BILATERAL TUBAL LIGATION N/A 08/26/2020   Procedure: CESAREAN SECTION WITH BILATERAL TUBAL LIGATION;  Surgeon: Kathrynn Running, MD;  Location: [facility] ORS;  Service: Obstetrics;  Laterality: N/A;   HYSTEROSCOPY WITH D & C Bilateral 01/02/2018   Procedure: DILATATION AND CURETTAGE /HYSTEROSCOPY MYOSURE MYOMECTOMY AND NOVASURE ENDOMETRIAL ABLATION;  Surgeon: Willodean Rosenthal, MD;  Location: [facility] ORS;  Service: Gynecology;  Laterality: Bilateral;   TONSILLECTOMY      Social History   Tobacco Use   Smoking status: Never   Smokeless tobacco: Never  Vaping Use   Vaping status: Never Used  Substance Use Topics   Alcohol use: Never   Drug use: No    Family History  Problem Relation Age of Onset   Arthritis Other    Hypertension Other    Stroke Other    Lupus Mother    Congestive Heart Failure Mother    Cancer Mother    Hypertension Mother    Diabetes Mother     Allergies  Allergen Reactions   Influenza Vaccines Other (See Comments) and Swelling    Arm swelling and felt very bad. Arm swelling and felt very  bad.   Penicillins Hives    REACTION: hives REACTION: hives    Medication list has been reviewed and updated.  Current Outpatient Medications on File Prior to Visit  Medication Sig Dispense Refill   norethindrone (AYGESTIN) 5 MG tablet Take 1 tablet (5 mg total) by mouth daily. Take for 3 weeks, then off for a week. 90 tablet 2   No current facility-administered medications on file prior to visit.    Review of Systems:  As per HPI- otherwise negative.   Physical Examination: Vitals:   07/26/23 1423  BP: 114/62  Pulse: 76  Resp: 18  Temp: 98.6 F (37 C)  SpO2: 99%   Vitals:   07/26/23 1423  Weight: 256 lb (116.1 kg)  Height: 5\' 7"  (1.702 m)   Body mass index is 40.1 kg/m. Ideal Body Weight: Weight in (lb) to have BMI = 25:  159.3  GEN: no acute distress.  Obese, looks well  HEENT: Atraumatic, Normocephalic.  Bilateral TM wnl, oropharynx normal.  PEERL,EOMI.   Ears and Nose: No external deformity. CV: RRR, No M/G/R. No JVD. No thrill. No extra heart sounds. PULM: CTA B, no wheezes, crackles, rhonchi. No retractions. No resp. distress. No accessory muscle use. ABD: S, ND, +BS. No rebound. No HSM.  Left sided abd tenderness upper and lower quad  EXTR: No c/c/e PSYCH: Normally interactive. Conversant.   HCG negative  Assessment and Plan: Hematemesis with nausea - Plan: POCT urine pregnancy Pt seen today with nausea, vomiting for about 4 days Now with some streaks of blood in vomitus likely from irritation of her esophagus She feels bad, has tried zofran at home but did not stop vomiting She would like to be seen in the ER for further evaluation and likely IV hydration- she will proceed to our ER now with staff escort.  Appreciate ER care of this nice patient   Signed Abbe Amsterdam, MD

## 2023-07-31 ENCOUNTER — Encounter: Payer: Self-pay | Admitting: Family Medicine

## 2023-07-31 DIAGNOSIS — K920 Hematemesis: Secondary | ICD-10-CM

## 2023-08-01 NOTE — Addendum Note (Signed)
Addended by: Abbe Amsterdam C on: 08/01/2023 06:36 AM   Modules accepted: Orders

## 2023-08-04 ENCOUNTER — Ambulatory Visit (HOSPITAL_BASED_OUTPATIENT_CLINIC_OR_DEPARTMENT_OTHER): Payer: Managed Care, Other (non HMO)

## 2023-08-09 ENCOUNTER — Ambulatory Visit (HOSPITAL_BASED_OUTPATIENT_CLINIC_OR_DEPARTMENT_OTHER): Admission: RE | Admit: 2023-08-09 | Payer: Managed Care, Other (non HMO) | Source: Ambulatory Visit

## 2023-08-22 ENCOUNTER — Ambulatory Visit (HOSPITAL_BASED_OUTPATIENT_CLINIC_OR_DEPARTMENT_OTHER): Admission: RE | Admit: 2023-08-22 | Payer: Managed Care, Other (non HMO) | Source: Ambulatory Visit

## 2023-08-22 ENCOUNTER — Encounter (HOSPITAL_BASED_OUTPATIENT_CLINIC_OR_DEPARTMENT_OTHER): Payer: Self-pay

## 2023-11-20 ENCOUNTER — Encounter: Payer: Self-pay | Admitting: Family Medicine

## 2024-01-03 ENCOUNTER — Telehealth: Payer: Self-pay

## 2024-01-03 NOTE — Telephone Encounter (Signed)
 Patient called in inquiring about an appointment tomorrow with Dr. Cathyann Cobia. She stated she has a rash that is on her chest only. I informed patient that Dr. Micheal Agent schedule is full tomorrow but I have availability with Susi Eric on Tuesday.   Patient did not want to wait until Tuesday. Also informed the patient that her PCP can assist her as well.

## 2024-01-05 ENCOUNTER — Encounter: Payer: Self-pay | Admitting: Family Medicine

## 2024-08-18 ENCOUNTER — Emergency Department (HOSPITAL_BASED_OUTPATIENT_CLINIC_OR_DEPARTMENT_OTHER)

## 2024-08-18 ENCOUNTER — Encounter (HOSPITAL_BASED_OUTPATIENT_CLINIC_OR_DEPARTMENT_OTHER): Payer: Self-pay | Admitting: Emergency Medicine

## 2024-08-18 ENCOUNTER — Emergency Department (HOSPITAL_BASED_OUTPATIENT_CLINIC_OR_DEPARTMENT_OTHER)
Admission: EM | Admit: 2024-08-18 | Discharge: 2024-08-18 | Disposition: A | Discharge status: Home / Self Care | Attending: Emergency Medicine | Admitting: Emergency Medicine

## 2024-08-18 ENCOUNTER — Other Ambulatory Visit: Payer: Self-pay

## 2024-08-18 DIAGNOSIS — R200 Anesthesia of skin: Secondary | ICD-10-CM

## 2024-08-18 NOTE — Discharge Instructions (Addendum)
 As we discussed, your ultrasound was normal and your hand x-ray was also normal.  I going to have you follow-up with vascular surgery for further evaluation.  Please call and schedule an appointment.  I would like for you to return to the emergency department sooner for any worsening symptoms including increased level of hand pain increased level of numbness or weakness in the right hand or any concerns you might have.

## 2024-08-18 NOTE — ED Triage Notes (Signed)
 Pt reports pain, tingling to RT hand x 1 wk; having trouble holding things and writing; no injury

## 2024-08-18 NOTE — ED Provider Notes (Cosign Needed Addendum)
 [place] [facility] Provider Note   CSN: [id number] Arrival date & time: 08/18/24  1521     Patient presents with: Hand Pain   Stefanie Hale is a 43 y.o. female presenting for numbness/tingling in Right hand and digits. She states this started 2 weeks ago, and she has started dropping items in the Right hand. 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 Pain has a sharp characteristic that improves with rest. She notices no differences in relation to heat or cold exposure. She denies a history of smoking, and states no past medical history. She denies fever, pruritus, rash, edema, or recent wounds. She states sensation feels decreased in the right hand, and normal in the left hand. The symptoms do not extend further than the metacarpals.     Hand Pain       Prior to Admission medications   Medication Sig Start Date End Date Taking? Authorizing Provider  norethindrone  (AYGESTIN ) 5 MG tablet Take 1 tablet (5 mg total) by mouth daily. Take for 3 weeks, then off for a week. 02/23/23   Stinson, Jacob J, DO  ondansetron  (ZOFRAN -ODT) 4 MG disintegrating tablet Take 1 tablet (4 mg total) by mouth every 8 (eight) hours as needed for nausea or vomiting. 07/26/23   Aberman, Caroline C, PA-C    Allergies: Influenza vaccines and Penicillins    Review of Systems  Updated Vital Signs BP 121/80 (BP Location: Right Arm)   Pulse 72   Temp 98.2 F (36.8 C) (Oral)   Resp 18   Ht 5' 7 (1.702 m)   Wt 115.7 kg   SpO2 100%   BMI 39.94 kg/m   Physical Exam Vitals and nursing note reviewed.  Constitutional:      Appearance: Normal appearance.  HENT:     Head: Normocephalic and atraumatic.  Eyes:     General:        Right eye: No discharge.        Left eye: No discharge.     Conjunctiva/sclera: Conjunctivae normal.  Pulmonary:     Effort: Pulmonary effort is normal.   Musculoskeletal:     Comments: Good 2+ radial pulses felt bilaterally.  Slightly decreased cap refill in the right hand.  Palmar aspect of the right hand is slightly more pale than the left.  5/5 grip strength bilaterally.  Decreased sensation subjectively in the right hand.  Right and left upper extremity sensation is normal.  Skin:    General: Skin is warm and dry.     Findings: No rash.  Neurological:     General: No focal deficit present.     Mental Status: She is alert.  Psychiatric:        Mood and Affect: Mood normal.        Behavior: Behavior normal.     (all labs ordered are listed, but only abnormal results are displayed) Labs Reviewed - No data to display  EKG: None  Radiology: US  Venous Img Upper Uni Right(DVT) Result Date: 08/18/2024 EXAM: US  Duplex right Upper Extremity Veins. TECHNIQUE: Real-time ultrasound scan of the veins of the right upper extremity with color Doppler flow, spectral waveform analysis and compression. COMPARISON: None available. CLINICAL HISTORY: 855384 Pain [id number] FINDINGS: SUPERFICIAL VEINS: The cephalic and basilic veins are compressible, and demonstrate normal color Doppler flow. DEEP VEINS: The internal jugular, subclavian, axillary and brachial veins are compressible, and demonstrate normal  color Doppler flow. SOFT TISSUES: No acute finding. IMPRESSION: 1. No deep venous thrombosis in the right upper extremity. Electronically signed by: Lynwood Seip MD 08/18/2024 05:35 PM EST RP Workstation: [id number]   DG Hand Complete Right Result Date: 08/18/2024 CLINICAL DATA:  Right hand pain and tingling. Difficulty holding things and writing. EXAM: RIGHT HAND - COMPLETE 3+ VIEW COMPARISON:  None Available. FINDINGS: There is no evidence of acute fracture or dislocation. There is no evidence of arthropathy or other focal bone abnormality. Soft tissues are unremarkable. IMPRESSION: Negative. Electronically Signed   By: Leita Birmingham M.D.   On: 08/18/2024 15:55    Procedures   Medications Ordered in the ED - No data to display  Clinical Course as of 08/18/24 AMOS Repress Aug 18, 2024  1829 I spoke with Dr. Sharl who would recommend having her follow-up with vascular for further evaluation.  He agrees that no other emergency test needs to be done at this time. [CF]  1832 US  Venous Img Upper Uni Right(DVT) No evidence of DVT.  I do agree with radiologist interpretation. [CF]  1832 DG Hand Complete Right Negative.  I do agree with the radiologist interpretation. [CF]  1846 On repeat evaluation, I went over the imaging with her at the bedside.  On repeat exam, patient's numbness was improving.  The patient's color on the palmar surface of the hand was also improving.  Temperature now matches the patient's left hand.  I think this is likely vasospasm. Again will have her follow up with vascular surgery. Strict return precautions discussed.  [CF]    Clinical Course User Index [CF] Theotis Cameron HERO, PA-C    Medical Decision Making Henry L Alonzo is a 43 y.o. female patient who presents to the emergency department today for further evaluation of right hand numbness.  This been ongoing for approximately 2 weeks.  Imaging was performed in triage.  X-ray was normal which I interpreted.  I added on a DVT study looking for any upper extremity occlusion which was also normal.  I spoke with Dr. Sharl with hand surgery who recommends following up with vascular surgery.  This could be some vaso-occlusive disease.  Also physical exam is showing some findings of possible carpal tunnel.  Again low suspicion for any emergent vascular compromise at this time.  She does have strong radial pulses.  Again we will have her follow-up with vascular surgery.  Strict turn precautions were discussed.  She is safer discharge.   Amount and/or Complexity of Data Reviewed Radiology: ordered. Decision-making details documented in ED Course.     Final diagnoses:  Hand  numbness    ED Discharge Orders     None          Theotis Cameron HERO, [place] 08/18/24 1835    Theotis Cameron HERO, [place] 08/18/24 1848

## 2024-08-18 NOTE — ED Provider Notes (Signed)
 I provided a substantive portion of the care of this patient.  I personally made/approved the management plan for this patient and take responsibility for the patient management. {Remember to document shared critical care using "edcritical" dot phrase:1}

## 2024-10-22 ENCOUNTER — Ambulatory Visit: Admitting: Sports Medicine
